# Patient Record
Sex: Female | Born: 1985 | Race: Black or African American | Hispanic: No | Marital: Single | Smoking: Current every day smoker
Health system: Southern US, Community
[De-identification: ages and names within clinical notes are randomized; demographics above are authoritative.]

## PROBLEM LIST (undated history)

## (undated) ENCOUNTER — Inpatient Hospital Stay (HOSPITAL_COMMUNITY): Payer: Self-pay

## (undated) DIAGNOSIS — D649 Anemia, unspecified: Secondary | ICD-10-CM

## (undated) DIAGNOSIS — F419 Anxiety disorder, unspecified: Secondary | ICD-10-CM

## (undated) DIAGNOSIS — Z3490 Encounter for supervision of normal pregnancy, unspecified, unspecified trimester: Secondary | ICD-10-CM

## (undated) HISTORY — PX: INDUCED ABORTION: SHX677

## (undated) HISTORY — PX: DILATION AND CURETTAGE OF UTERUS: SHX78

---

## 2012-01-28 ENCOUNTER — Encounter (HOSPITAL_COMMUNITY): Payer: Self-pay | Admitting: Family Medicine

## 2012-01-28 ENCOUNTER — Emergency Department (HOSPITAL_COMMUNITY): Payer: Self-pay

## 2012-01-28 ENCOUNTER — Emergency Department (HOSPITAL_COMMUNITY)
Admission: EM | Admit: 2012-01-28 | Discharge: 2012-01-28 | Disposition: A | Discharge status: Home / Self Care | Payer: Self-pay | Attending: Emergency Medicine | Admitting: Emergency Medicine

## 2012-01-28 DIAGNOSIS — S0003XA Contusion of scalp, initial encounter: Secondary | ICD-10-CM | POA: Insufficient documentation

## 2012-01-28 DIAGNOSIS — Z8659 Personal history of other mental and behavioral disorders: Secondary | ICD-10-CM | POA: Insufficient documentation

## 2012-01-28 DIAGNOSIS — S01501A Unspecified open wound of lip, initial encounter: Secondary | ICD-10-CM | POA: Insufficient documentation

## 2012-01-28 DIAGNOSIS — S0083XA Contusion of other part of head, initial encounter: Secondary | ICD-10-CM

## 2012-01-28 DIAGNOSIS — S46909A Unspecified injury of unspecified muscle, fascia and tendon at shoulder and upper arm level, unspecified arm, initial encounter: Secondary | ICD-10-CM | POA: Insufficient documentation

## 2012-01-28 DIAGNOSIS — Z862 Personal history of diseases of the blood and blood-forming organs and certain disorders involving the immune mechanism: Secondary | ICD-10-CM | POA: Insufficient documentation

## 2012-01-28 DIAGNOSIS — S4980XA Other specified injuries of shoulder and upper arm, unspecified arm, initial encounter: Secondary | ICD-10-CM | POA: Insufficient documentation

## 2012-01-28 DIAGNOSIS — S01511A Laceration without foreign body of lip, initial encounter: Secondary | ICD-10-CM

## 2012-01-28 DIAGNOSIS — F17200 Nicotine dependence, unspecified, uncomplicated: Secondary | ICD-10-CM | POA: Insufficient documentation

## 2012-01-28 DIAGNOSIS — S0990XA Unspecified injury of head, initial encounter: Secondary | ICD-10-CM | POA: Insufficient documentation

## 2012-01-28 HISTORY — DX: Anemia, unspecified: D64.9

## 2012-01-28 HISTORY — DX: Anxiety disorder, unspecified: F41.9

## 2012-01-28 MED ORDER — OXYCODONE-ACETAMINOPHEN 7.5-500 MG PO TABS: 1.0000 | ORAL_TABLET | ORAL | Status: DC | PRN
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
## 2012-01-28 NOTE — ED Notes (Signed)
Patient transported to CT 
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
## 2012-08-09 ENCOUNTER — Emergency Department (INDEPENDENT_AMBULATORY_CARE_PROVIDER_SITE_OTHER)
Admission: EM | Admit: 2012-08-09 | Discharge: 2012-08-09 | Disposition: A | Discharge status: Nursing Home (Includes ICF) | Payer: Medicaid Other | Source: Home / Self Care | Attending: Family Medicine | Admitting: Family Medicine

## 2012-08-09 ENCOUNTER — Encounter (HOSPITAL_COMMUNITY): Payer: Self-pay

## 2012-08-09 ENCOUNTER — Encounter (HOSPITAL_COMMUNITY): Payer: Self-pay | Admitting: *Deleted

## 2012-08-09 ENCOUNTER — Inpatient Hospital Stay (HOSPITAL_COMMUNITY): Payer: Medicaid Other

## 2012-08-09 ENCOUNTER — Inpatient Hospital Stay (HOSPITAL_COMMUNITY)
Admission: AD | Admit: 2012-08-09 | Discharge: 2012-08-09 | Disposition: A | Discharge status: Home / Self Care | Payer: Medicaid Other | Source: Ambulatory Visit | Attending: Obstetrics & Gynecology | Admitting: Obstetrics & Gynecology

## 2012-08-09 DIAGNOSIS — O98819 Other maternal infectious and parasitic diseases complicating pregnancy, unspecified trimester: Secondary | ICD-10-CM | POA: Insufficient documentation

## 2012-08-09 DIAGNOSIS — R109 Unspecified abdominal pain: Secondary | ICD-10-CM | POA: Insufficient documentation

## 2012-08-09 DIAGNOSIS — O4690 Antepartum hemorrhage, unspecified, unspecified trimester: Secondary | ICD-10-CM

## 2012-08-09 DIAGNOSIS — A5901 Trichomonal vulvovaginitis: Secondary | ICD-10-CM | POA: Insufficient documentation

## 2012-08-09 DIAGNOSIS — O4692 Antepartum hemorrhage, unspecified, second trimester: Secondary | ICD-10-CM

## 2012-08-09 DIAGNOSIS — N760 Acute vaginitis: Secondary | ICD-10-CM | POA: Insufficient documentation

## 2012-08-09 DIAGNOSIS — O2390 Unspecified genitourinary tract infection in pregnancy, unspecified trimester: Secondary | ICD-10-CM

## 2012-08-09 DIAGNOSIS — B9689 Other specified bacterial agents as the cause of diseases classified elsewhere: Secondary | ICD-10-CM | POA: Insufficient documentation

## 2012-08-09 DIAGNOSIS — O0930 Supervision of pregnancy with insufficient antenatal care, unspecified trimester: Secondary | ICD-10-CM | POA: Insufficient documentation

## 2012-08-09 DIAGNOSIS — O0932 Supervision of pregnancy with insufficient antenatal care, second trimester: Secondary | ICD-10-CM

## 2012-08-09 HISTORY — DX: Anemia, unspecified: D64.9

## 2012-08-09 HISTORY — DX: Encounter for supervision of normal pregnancy, unspecified, unspecified trimester: Z34.90

## 2012-08-09 LAB — WET PREP, GENITAL: Yeast Wet Prep HPF POC: NONE SEEN

## 2012-08-09 LAB — POCT PREGNANCY, URINE: Preg Test, Ur: POSITIVE — AB

## 2012-08-09 MED ORDER — ONDANSETRON HCL 4 MG PO TABS: 4.0000 mg | ORAL_TABLET | Freq: Once | ORAL | Status: DC

## 2012-08-09 MED ORDER — ONDANSETRON HCL 4 MG PO TABS
4.0000 mg | ORAL_TABLET | Freq: Once | ORAL | Status: AC
  Administered 2012-08-09: 4 mg via ORAL
  Filled 2012-08-09: qty 1

## 2012-08-09 MED ORDER — SODIUM CHLORIDE 0.9 % IV SOLN: Freq: Once | INTRAVENOUS | Status: AC

## 2012-08-09 MED ORDER — METRONIDAZOLE 500 MG PO TABS
2000.0000 mg | ORAL_TABLET | Freq: Once | ORAL | Status: AC
  Administered 2012-08-09: 2000 mg via ORAL
  Filled 2012-08-09: qty 4

## 2012-08-09 MED ORDER — AZITHROMYCIN 250 MG PO TABS
1000.0000 mg | ORAL_TABLET | Freq: Once | ORAL | Status: AC
  Administered 2012-08-09: 1000 mg via ORAL
  Filled 2012-08-09: qty 4

## 2012-08-09 NOTE — ED Provider Notes (Signed)
   History    CSN: [id number] Arrival date & time 08/09/12  1814  First MD Initiated Contact with Patient 08/09/12 1845     Chief Complaint  Patient presents with  . Vaginal Bleeding  . Abdominal Pain   (Consider location/radiation/quality/duration/timing/severity/associated sxs/prior Treatment) Patient is a 27 y.o. female presenting with vaginal bleeding and abdominal pain. The history is provided by the patient.  Vaginal Bleeding Quality:  Bright red Severity:  Moderate Duration:  3 days Timing:  Intermittent Progression:  Partially resolved (lmp 01/2012,no prenatal care, has had fetal movements today, G2P0.ab1.) Chronicity:  New Number of pads used:  2-3 daily. Possible pregnancy: yes   Associated symptoms: abdominal pain   Associated symptoms: no nausea   Abdominal Pain Associated symptoms include abdominal pain.   Past Medical History  Diagnosis Date  . Anemia   . Pregnancy    Past Surgical History  Procedure Laterality Date  . Induced abortion      Oct 2013   No family history on file. History  Substance Use Topics  . Smoking status: Never Smoker   . Smokeless tobacco: Not on file  . Alcohol Use: No   OB History   Grav Para Term Preterm Abortions TAB SAB Ect Mult Living                 Review of Systems  Constitutional: Negative.   Gastrointestinal: Positive for abdominal pain. Negative for nausea and vomiting.  Genitourinary: Positive for vaginal bleeding and pelvic pain. Negative for flank pain.    Allergies  Review of patient's allergies indicates no known allergies.  Home Medications  No current outpatient prescriptions on file. BP 113/74  Pulse 79  Temp(Src) 98.6 F (37 C) (Oral)  Resp 18  SpO2 100%  LMP 02/05/2012 Physical Exam  Nursing note and vitals reviewed. Constitutional: She is oriented to person, place, and time. She appears well-developed and well-nourished.  Abdominal: Soft. Bowel sounds are normal. She exhibits distension.  She exhibits no mass. There is no tenderness. There is no rebound and no guarding.  Genitourinary: Uterus is enlarged. Uterus is not tender.  fht 138 uterus at umbilicus, nontender.  Neurological: She is alert and oriented to person, place, and time.  Skin: Skin is warm and dry.    ED Course  Procedures (including critical care time) Labs Reviewed  POCT PREGNANCY, URINE - Abnormal; Notable for the following:    Preg Test, Ur POSITIVE (*)    All other components within normal limits   No results found. 1. Vaginal bleeding in pregnancy, second trimester     MDM  Sent for eval of 2nd trimester bleeding, no prenatal care.  Linna Hoff, MD 08/09/12 Mikle Bosworth

## 2012-08-09 NOTE — Discharge Instructions (Signed)
Trichomoniasis Trichomoniasis is an infection, caused by the Trichomonas organism, that affects both women and men. In women, the outer female genitalia and the vagina are affected. In men, the penis is mainly affected, but the prostate and other reproductive organs can also be involved. Trichomoniasis is a sexually transmitted disease (STD) and is most often passed to another person through sexual contact. The majority of people who get trichomoniasis do so from a sexual encounter and are also at risk for other STDs. CAUSES   Sexual intercourse with an infected partner.  It can be present in swimming pools or hot tubs. SYMPTOMS   Abnormal gray-green frothy vaginal discharge in women.  Vaginal itching and irritation in women.  Itching and irritation of the area outside the vagina in women.  Penile discharge with or without pain in males.  Inflammation of the urethra (urethritis), causing painful urination.  Bleeding after sexual intercourse. RELATED COMPLICATIONS  Pelvic inflammatory disease.  Infection of the uterus (endometritis).  Infertility.  Tubal (ectopic) pregnancy.  It can be associated with other STDs, including gonorrhea and chlamydia, hepatitis B, and HIV. COMPLICATIONS DURING PREGNANCY  Early (premature) delivery.  Premature rupture of the membranes (PROM).  Low birth weight. DIAGNOSIS   Visualization of Trichomonas under the microscope from the vagina discharge.  Ph of the vagina greater than 4.5, tested with a test tape.  Trich Rapid Test.  Culture of the organism, but this is not usually needed.  It may be found on a Pap test.  Having a "strawberry cervix,"which means the cervix looks very red like a strawberry. TREATMENT   You may be given medication to fight the infection. Inform your caregiver if you could be or are pregnant. Some medications used to treat the infection should not be taken during pregnancy.  Over-the-counter medications or  creams to decrease itching or irritation may be recommended.  Your sexual partner will need to be treated if infected. HOME CARE INSTRUCTIONS   Take all medication prescribed by your caregiver.  Take over-the-counter medication for itching or irritation as directed by your caregiver.  Do not have sexual intercourse while you have the infection.  Do not douche or wear tampons.  Discuss your infection with your partner, as your partner may have acquired the infection from you. Or, your partner may have been the person who transmitted the infection to you.  Have your sex partner examined and treated if necessary.  Practice safe, informed, and protected sex.  See your caregiver for other STD testing. SEEK MEDICAL CARE IF:   You still have symptoms after you finish the medication.  You have an oral temperature above 102 F (38.9 C).  You develop belly (abdominal) pain.  You have pain when you urinate.  You have bleeding after sexual intercourse.  You develop a rash.  The medication makes you sick or makes you throw up (vomit). Document Released: 07/09/2000 Document Revised: 04/07/2011 Document Reviewed: 08/04/2008 [facility] Patient Information 2014 [place], [place].  Sexually Transmitted Disease Sexually transmitted disease (STD) refers to any infection that is passed from person to person during sexual activity. This may happen by way of saliva, semen, blood, vaginal mucus, or urine. Common STDs include:  Gonorrhea.  Chlamydia.  Syphilis.  HIV/AIDS.  Genital herpes.  Hepatitis B and C.  Trichomonas.  Human papillomavirus (HPV).  Pubic lice. CAUSES  An STD may be spread by bacteria, virus, or parasite. A person can get an STD by:  Sexual intercourse with an infected person.  Sharing sex toys with an infected person.  Sharing needles with an infected person.  Having intimate contact with the genitals, mouth, or rectal areas of an infected person. SYMPTOMS   Some people may not have any symptoms, but they can still pass the infection to others. Different STDs have different symptoms. Symptoms include:  Painful or bloody urination.  Pain in the pelvis, abdomen, vagina, anus, throat, or eyes.  Skin rash, itching, irritation, growths, or sores (lesions). These usually occur in the genital or anal area.  Abnormal vaginal discharge.  Penile discharge in men.  Soft, flesh-colored skin growths in the genital or anal area.  Fever.  Pain or bleeding during sexual intercourse.  Swollen glands in the groin area.  Yellow skin and eyes (jaundice). This is seen with hepatitis. DIAGNOSIS  To make a diagnosis, your caregiver may:  Take a medical history.  Perform a physical exam.  Take a specimen (culture) to be examined.  Examine a sample of discharge under a microscope.  Perform blood tests.  Perform a Pap test, if this applies.  Perform a colposcopy.  Perform a laparoscopy. TREATMENT   Chlamydia, gonorrhea, trichomonas, and syphilis can be cured with antibiotic medicine.  Genital herpes, hepatitis, and HIV can be treated, but not cured, with prescribed medicines. The medicines will lessen the symptoms.  Genital warts from HPV can be treated with medicine or by freezing, burning (electrocautery), or surgery. Warts may come back.  HPV is a virus and cannot be cured with medicine or surgery.However, abnormal areas may be followed very closely by your caregiver and may be removed from the cervix, vagina, or vulva through office procedures or surgery. If your diagnosis is confirmed, your recent sexual partners need treatment. This is true even if they are symptom-free or have a negative culture or evaluation. They should not have sex until their caregiver says it is okay. HOME CARE INSTRUCTIONS  All sexual partners should be informed, tested, and treated for all STDs.  Take your antibiotics as directed. Finish them even if you start  to feel better.  Only take over-the-counter or prescription medicines for pain, discomfort, or fever as directed by your caregiver.  Rest.  Eat a balanced diet and drink enough fluids to keep your urine clear or pale yellow.  Do not have sex until treatment is completed and you have followed up with your caregiver. STDs should be checked after treatment.  Keep all follow-up appointments, Pap tests, and blood tests as directed by your caregiver.  Only use latex condoms and water-soluble lubricants during sexual activity. Do not use petroleum jelly or oils.  Avoid alcohol and illegal drugs.  Get vaccinated for HPV and hepatitis. If you have not received these vaccines in the past, talk to your caregiver about whether one or both might be right for you.  Avoid risky sex practices that can break the skin. The only way to avoid getting an STD is to avoid all sexual activity.Latex condoms and dental dams (for oral sex) will help lessen the risk of getting an STD, but will not completely eliminate the risk. SEEK MEDICAL CARE IF:   You have a fever.  You have any new or worsening symptoms. Document Released: 04/05/2002 Document Revised: 04/07/2011 Document Reviewed: 04/12/2010 [facility] Patient Information 2014 [place], [place].  Pregnancy - Second Trimester The second trimester of pregnancy (3 to 6 months) is a period of rapid growth for you and your baby. At the end of the sixth month, your baby is  about 9 inches long and weighs 1 1/2 pounds. You will begin to feel the baby move between 18 and 20 weeks of the pregnancy. This is called quickening. Weight gain is faster. A clear fluid (colostrum) may leak out of your breasts. You may feel small contractions of the womb (uterus). This is known as false labor or Braxton-Hicks contractions. This is like a practice for labor when the baby is ready to be born. Usually, the problems with morning sickness have usually passed by the end of your first  trimester. Some women develop small dark blotches (called cholasma, mask of pregnancy) on their face that usually goes away after the baby is born. Exposure to the sun makes the blotches worse. Acne may also develop in some pregnant women and pregnant women who have acne, may find that it goes away. PRENATAL EXAMS  Blood work may continue to be done during prenatal exams. These tests are done to check on your health and the probable health of your baby. Blood work is used to follow your blood levels (hemoglobin). Anemia (low hemoglobin) is common during pregnancy. Iron and vitamins are given to help prevent this. You will also be checked for diabetes between 24 and 28 weeks of the pregnancy. Some of the previous blood tests may be repeated.  The size of the uterus is measured during each visit. This is to make sure that the baby is continuing to grow properly according to the dates of the pregnancy.  Your blood pressure is checked every prenatal visit. This is to make sure you are not getting toxemia.  Your urine is checked to make sure you do not have an infection, diabetes or protein in the urine.  Your weight is checked often to make sure gains are happening at the suggested rate. This is to ensure that both you and your baby are growing normally.  Sometimes, an ultrasound is performed to confirm the proper growth and development of the baby. This is a test which bounces harmless sound waves off the baby so your caregiver can more accurately determine due dates. Sometimes, a test is done on the amniotic fluid surrounding the baby. This test is called an amniocentesis. The amniotic fluid is obtained by sticking a needle into the belly (abdomen). This is done to check the chromosomes in instances where there is a concern about possible genetic problems with the baby. It is also sometimes done near the end of pregnancy if an early delivery is required. In this case, it is done to help make sure the  baby's lungs are mature enough for the baby to live outside of the womb. CHANGES OCCURING IN THE SECOND TRIMESTER OF PREGNANCY Your body goes through many changes during pregnancy. They vary from person to person. Talk to your caregiver about changes you notice that you are concerned about.  During the second trimester, you will likely have an increase in your appetite. It is normal to have cravings for certain foods. This varies from person to person and pregnancy to pregnancy.  Your lower abdomen will begin to bulge.  You may have to urinate more often because the uterus and baby are pressing on your bladder. It is also common to get more bladder infections during pregnancy. You can help this by drinking lots of fluids and emptying your bladder before and after intercourse.  You may begin to get stretch marks on your hips, abdomen, and breasts. These are normal changes in the body during pregnancy. There are  no exercises or medicines to take that prevent this change.  You may begin to develop swollen and bulging veins (varicose veins) in your legs. Wearing support hose, elevating your feet for 15 minutes, 3 to 4 times a day and limiting salt in your diet helps lessen the problem.  Heartburn may develop as the uterus grows and pushes up against the stomach. Antacids recommended by your caregiver helps with this problem. Also, eating smaller meals 4 to 5 times a day helps.  Constipation can be treated with a stool softener or adding bulk to your diet. Drinking lots of fluids, and eating vegetables, fruits, and whole grains are helpful.  Exercising is also helpful. If you have been very active up until your pregnancy, most of these activities can be continued during your pregnancy. If you have been less active, it is helpful to start an exercise program such as walking.  Hemorrhoids may develop at the end of the second trimester. Warm sitz baths and hemorrhoid cream recommended by your caregiver  helps hemorrhoid problems.  Backaches may develop during this time of your pregnancy. Avoid heavy lifting, wear low heal shoes, and practice good posture to help with backache problems.  Some pregnant women develop tingling and numbness of their hand and fingers because of swelling and tightening of ligaments in the wrist (carpel tunnel syndrome). This goes away after the baby is born.  As your breasts enlarge, you may have to get a bigger bra. Get a comfortable, cotton, support bra. Do not get a nursing bra until the last month of the pregnancy if you will be nursing the baby.  You may get a dark line from your belly button to the pubic area called the linea nigra.  You may develop rosy cheeks because of increase blood flow to the face.  You may develop spider looking lines of the face, neck, arms, and chest. These go away after the baby is born. HOME CARE INSTRUCTIONS   It is extremely important to avoid all smoking, herbs, alcohol, and unprescribed drugs during your pregnancy. These chemicals affect the formation and growth of the baby. Avoid these chemicals throughout the pregnancy to ensure the delivery of a healthy infant.  Most of your home care instructions are the same as suggested for the first trimester of your pregnancy. Keep your caregiver's appointments. Follow your caregiver's instructions regarding medicine use, exercise, and diet.  During pregnancy, you are providing food for you and your baby. Continue to eat regular, well-balanced meals. Choose foods such as meat, fish, milk and other low fat dairy products, vegetables, fruits, and whole-grain breads and cereals. Your caregiver will tell you of the ideal weight gain.  A physical sexual relationship may be continued up until near the end of pregnancy if there are no other problems. Problems could include early (premature) leaking of amniotic fluid from the membranes, vaginal bleeding, abdominal pain, or other medical or  pregnancy problems.  Exercise regularly if there are no restrictions. Check with your caregiver if you are unsure of the safety of some of your exercises. The greatest weight gain will occur in the last 2 trimesters of pregnancy. Exercise will help you:  Control your weight.  Get you in shape for labor and delivery.  Lose weight after you have the baby.  Wear a good support or jogging bra for breast tenderness during pregnancy. This may help if worn during sleep. Pads or tissues may be used in the bra if you are leaking colostrum.  Do not use hot tubs, steam rooms or saunas throughout the pregnancy.  Wear your seat belt at all times when driving. This protects you and your baby if you are in an accident.  Avoid raw meat, uncooked cheese, cat litter boxes, and soil used by cats. These carry germs that can cause birth defects in the baby.  The second trimester is also a good time to visit your dentist for your dental health if this has not been done yet. Getting your teeth cleaned is okay. Use a soft toothbrush. Brush gently during pregnancy.  It is easier to leak urine during pregnancy. Tightening up and strengthening the pelvic muscles will help with this problem. Practice stopping your urination while you are going to the bathroom. These are the same muscles you need to strengthen. It is also the muscles you would use as if you were trying to stop from passing gas. You can practice tightening these muscles up 10 times a set and repeating this about 3 times per day. Once you know what muscles to tighten up, do not perform these exercises during urination. It is more likely to contribute to an infection by backing up the urine.  Ask for help if you have financial, counseling, or nutritional needs during pregnancy. Your caregiver will be able to offer counseling for these needs as well as refer you for other special needs.  Your skin may become oily. If so, wash your face with mild soap, use  non-greasy moisturizer and oil or cream based makeup. MEDICINES AND DRUG USE IN PREGNANCY  Take prenatal vitamins as directed. The vitamin should contain 1 milligram of folic acid. Keep all vitamins out of reach of children. Only a couple vitamins or tablets containing iron may be fatal to a baby or young child when ingested.  Avoid use of all medicines, including herbs, over-the-counter medicines, not prescribed or suggested by your caregiver. Only take over-the-counter or prescription medicines for pain, discomfort, or fever as directed by your caregiver. Do not use aspirin.  Let your caregiver also know about herbs you may be using.  Alcohol is related to a number of birth defects. This includes fetal alcohol syndrome. All alcohol, in any form, should be avoided completely. Smoking will cause low birth rate and premature babies.  Street or illegal drugs are very harmful to the baby. They are absolutely forbidden. A baby born to an addicted mother will be addicted at birth. The baby will go through the same withdrawal an adult does. SEEK MEDICAL CARE IF:  You have any concerns or worries during your pregnancy. It is better to call with your questions if you feel they cannot wait, rather than worry about them. SEEK IMMEDIATE MEDICAL CARE IF:   An unexplained oral temperature above 102 F (38.9 C) develops, or as your caregiver suggests.  You have leaking of fluid from the vagina (birth canal). If leaking membranes are suspected, take your temperature and tell your caregiver of this when you call.  There is vaginal spotting, bleeding, or passing clots. Tell your caregiver of the amount and how many pads are used. Light spotting in pregnancy is common, especially following intercourse.  You develop a bad smelling vaginal discharge with a change in the color from clear to white.  You continue to feel sick to your stomach (nauseated) and have no relief from remedies suggested. You vomit blood  or coffee ground-like materials.  You lose more than 2 pounds of weight or gain more than 2  pounds of weight over 1 week, or as suggested by your caregiver.  You notice swelling of your face, hands, feet, or legs.  You get exposed to [place] measles and have never had them.  You are exposed to fifth disease or chickenpox.  You develop belly (abdominal) pain. Round ligament discomfort is a common non-cancerous (benign) cause of abdominal pain in pregnancy. Your caregiver still must evaluate you.  You develop a bad headache that does not go away.  You develop fever, diarrhea, pain with urination, or shortness of breath.  You develop visual problems, blurry, or double vision.  You fall or are in a car accident or any kind of trauma.  There is mental or physical violence at home. Document Released: 01/07/2001 Document Revised: 10/08/2011 Document Reviewed: 07/12/2008 [facility] Patient Information 2014 [place], [place].

## 2012-08-10 LAB — GC/CHLAMYDIA PROBE AMP
CT Probe RNA: NEGATIVE
GC Probe RNA: NEGATIVE

## 2012-08-16 ENCOUNTER — Ambulatory Visit (HOSPITAL_COMMUNITY)
Admission: RE | Admit: 2012-08-16 | Discharge: 2012-08-16 | Disposition: A | Discharge status: Home / Self Care | Payer: Medicaid Other | Source: Ambulatory Visit | Attending: Family Medicine | Admitting: Family Medicine

## 2012-08-16 DIAGNOSIS — A5901 Trichomonal vulvovaginitis: Secondary | ICD-10-CM

## 2012-08-16 DIAGNOSIS — Z3689 Encounter for other specified antenatal screening: Secondary | ICD-10-CM | POA: Insufficient documentation

## 2012-08-16 DIAGNOSIS — O0930 Supervision of pregnancy with insufficient antenatal care, unspecified trimester: Secondary | ICD-10-CM | POA: Insufficient documentation

## 2012-09-01 ENCOUNTER — Encounter: Payer: Self-pay | Admitting: Family Medicine

## 2012-10-06 ENCOUNTER — Other Ambulatory Visit (HOSPITAL_COMMUNITY)
Admission: RE | Admit: 2012-10-06 | Discharge: 2012-10-06 | Disposition: A | Discharge status: Home / Self Care | Payer: Medicaid Other | Source: Ambulatory Visit | Attending: Obstetrics and Gynecology | Admitting: Obstetrics and Gynecology

## 2012-10-06 ENCOUNTER — Encounter: Payer: Self-pay | Admitting: Advanced Practice Midwife

## 2012-10-06 ENCOUNTER — Ambulatory Visit (INDEPENDENT_AMBULATORY_CARE_PROVIDER_SITE_OTHER): Payer: Medicaid Other | Admitting: Advanced Practice Midwife

## 2012-10-06 VITALS — BP 122/80 | Ht 65.0 in | Wt 169.5 lb

## 2012-10-06 DIAGNOSIS — Z3490 Encounter for supervision of normal pregnancy, unspecified, unspecified trimester: Secondary | ICD-10-CM

## 2012-10-06 DIAGNOSIS — Z01419 Encounter for gynecological examination (general) (routine) without abnormal findings: Secondary | ICD-10-CM | POA: Insufficient documentation

## 2012-10-06 DIAGNOSIS — O9989 Other specified diseases and conditions complicating pregnancy, childbirth and the puerperium: Secondary | ICD-10-CM

## 2012-10-06 DIAGNOSIS — Z113 Encounter for screening for infections with a predominantly sexual mode of transmission: Secondary | ICD-10-CM | POA: Insufficient documentation

## 2012-10-06 DIAGNOSIS — O0932 Supervision of pregnancy with insufficient antenatal care, second trimester: Secondary | ICD-10-CM

## 2012-10-06 DIAGNOSIS — O26899 Other specified pregnancy related conditions, unspecified trimester: Secondary | ICD-10-CM

## 2012-10-06 DIAGNOSIS — R12 Heartburn: Secondary | ICD-10-CM

## 2012-10-06 DIAGNOSIS — O0930 Supervision of pregnancy with insufficient antenatal care, unspecified trimester: Secondary | ICD-10-CM

## 2012-10-06 LAB — POCT URINALYSIS DIP (DEVICE)
Bilirubin Urine: NEGATIVE
Glucose, UA: NEGATIVE mg/dL
Hgb urine dipstick: NEGATIVE
Leukocytes, UA: NEGATIVE
Nitrite: NEGATIVE
Protein, ur: NEGATIVE mg/dL
Specific Gravity, Urine: 1.025 (ref 1.005–1.030)
Urobilinogen, UA: 4 mg/dL — ABNORMAL HIGH (ref 0.0–1.0)
pH: 6 (ref 5.0–8.0)

## 2012-10-06 MED ORDER — RANITIDINE HCL 150 MG PO TABS: 150.0000 mg | ORAL_TABLET | Freq: Two times a day (BID) | ORAL | Status: AC

## 2012-10-06 NOTE — Progress Notes (Addendum)
   Subjective:    Ashley Lee is a G3P0020 [redacted]w[redacted]d being seen today for her first obstetrical visit.  Her obstetrical history is significant for 1 TAB and 1 SAB and late to care this pregnancy. Patient does intend to breast feed. Pregnancy history fully reviewed.  Patient reports heartburn.  Filed Vitals:   10/06/12 0851 10/06/12 0854  BP: 122/80   Height:  5\' 5"  (1.651 m)  Weight: 169 lb 8 oz (76.885 kg)     HISTORY: OB History  Gravida Para Term Preterm AB SAB TAB Ectopic Multiple Living  3 0 0 0 2 1 1 0 0 0     # Outcome Date GA Lbr Len/2nd Weight Sex Delivery Anes PTL Lv  3 CUR           2 SAB           1 TAB              Past Medical History  Diagnosis Date  . Anemia   . Pregnancy    Past Surgical History  Procedure Laterality Date  . Induced abortion      Oct 2013  . Dilation and curettage of uterus     Family History  Problem Relation Age of Onset  . Cancer Maternal Grandmother   . Cancer Paternal Grandmother   . Diabetes Paternal Grandmother   . Hypertension Paternal Grandmother      Exam    Uterus:  Fundal Height: 32 cm  Pelvic Exam:    Perineum: No Hemorrhoids, Normal Perineum   Vulva: normal   Vagina:  normal mucosa, normal discharge   pH:    Cervix: no bleeding following Pap, no cervical motion tenderness, no lesions and nulliparous appearance   Adnexa: not evaluated   Bony Pelvis: average  System: Breast:  normal appearance, no masses or tenderness   Skin: normal coloration and turgor, no rashes    Neurologic: oriented, normal, normal mood   Extremities: normal strength, tone, and muscle mass   HEENT thyroid without masses   Mouth/Teeth mucous membranes moist, pharynx normal without lesions   Neck supple   Cardiovascular: regular rate and rhythm   Respiratory:  appears well, vitals normal, no respiratory distress, acyanotic, normal RR, ear and throat exam is normal, neck free of mass or lymphadenopathy, chest clear, no wheezing,  crepitations, rhonchi, normal symmetric air entry   Abdomen: soft, non-tender; bowel sounds normal; no masses,  no organomegaly   Urinary: urethral meatus normal      Assessment:    Pregnancy: G3P0020 There are no active problems to display for this patient.       Plan:     Initial labs drawn. Prenatal vitamins. Zantac 150 mg BID. Problem list reviewed and updated. Genetic Screening discussed: Late to care  Ultrasound discussed; fetal survey: results reviewed.  Follow up in 2 weeks. 50% of 30 min visit spent on counseling and coordination of care.     LEFTWICH-KIRBY, Faiza Bansal 10/06/2012

## 2012-10-07 LAB — CULTURE, OB URINE
Colony Count: NO GROWTH
Organism ID, Bacteria: NO GROWTH

## 2012-10-07 LAB — HIV ANTIBODY (ROUTINE TESTING W REFLEX): HIV: NONREACTIVE

## 2012-10-07 LAB — GLUCOSE TOLERANCE, 1 HOUR (50G) W/O FASTING: Glucose, 1 Hour GTT: 60 mg/dL — ABNORMAL LOW (ref 70–140)

## 2012-10-08 LAB — OBSTETRIC PANEL
Antibody Screen: POSITIVE — AB
Basophils Absolute: 0 10*3/uL (ref 0.0–0.1)
Basophils Relative: 0 % (ref 0–1)
Eosinophils Absolute: 0.3 10*3/uL (ref 0.0–0.7)
Eosinophils Relative: 4 % (ref 0–5)
HCT: 33.1 % — ABNORMAL LOW (ref 36.0–46.0)
Hemoglobin: 11.1 g/dL — ABNORMAL LOW (ref 12.0–15.0)
Hepatitis B Surface Ag: NEGATIVE
Lymphocytes Relative: 28 % (ref 12–46)
Lymphs Abs: 2.2 10*3/uL (ref 0.7–4.0)
MCH: 33.6 pg (ref 26.0–34.0)
MCHC: 33.5 g/dL (ref 30.0–36.0)
MCV: 100.3 fL — ABNORMAL HIGH (ref 78.0–100.0)
Monocytes Absolute: 0.7 10*3/uL (ref 0.1–1.0)
Monocytes Relative: 8 % (ref 3–12)
Neutro Abs: 4.6 10*3/uL (ref 1.7–7.7)
Neutrophils Relative %: 60 % (ref 43–77)
Platelets: 309 10*3/uL (ref 150–400)
RBC: 3.3 MIL/uL — ABNORMAL LOW (ref 3.87–5.11)
RDW: 13.5 % (ref 11.5–15.5)
Rh Type: NEGATIVE
Rubella: 6.31 Index — ABNORMAL HIGH (ref <0.90)
WBC: 7.8 10*3/uL (ref 4.0–10.5)

## 2012-10-08 LAB — PRENATAL ANTIBODY IDENTIFICATION

## 2012-10-08 LAB — HEMOGLOBINOPATHY EVALUATION
Hemoglobin Other: 0 %
Hgb A2 Quant: 2.6 % (ref 2.2–3.2)
Hgb A: 97.4 % (ref 96.8–97.8)
Hgb F Quant: 0 % (ref 0.0–2.0)
Hgb S Quant: 0 %

## 2012-10-08 LAB — ANTIBODY TITER (PRENATAL TITER): Ab Titer: 16

## 2012-10-13 ENCOUNTER — Encounter: Payer: Self-pay | Admitting: Advanced Practice Midwife

## 2012-10-20 ENCOUNTER — Encounter: Payer: Medicaid Other | Admitting: Advanced Practice Midwife

## 2013-06-14 ENCOUNTER — Encounter (HOSPITAL_COMMUNITY): Payer: Self-pay | Admitting: *Deleted

## 2013-11-28 ENCOUNTER — Encounter (HOSPITAL_COMMUNITY): Payer: Self-pay | Admitting: *Deleted
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
## 2022-06-09 ENCOUNTER — Other Ambulatory Visit (HOSPITAL_COMMUNITY)
Admission: EM | Admit: 2022-06-09 | Discharge: 2022-06-13 | Payer: Medicaid - Out of State | Attending: Psychiatry | Admitting: Psychiatry

## 2022-06-09 DIAGNOSIS — F3181 Bipolar II disorder: Secondary | ICD-10-CM | POA: Insufficient documentation

## 2022-06-09 DIAGNOSIS — F10229 Alcohol dependence with intoxication, unspecified: Secondary | ICD-10-CM | POA: Insufficient documentation

## 2022-06-09 DIAGNOSIS — R45851 Suicidal ideations: Secondary | ICD-10-CM | POA: Diagnosis not present

## 2022-06-09 DIAGNOSIS — F1090 Alcohol use, unspecified, uncomplicated: Secondary | ICD-10-CM | POA: Diagnosis present

## 2022-06-09 DIAGNOSIS — F4310 Post-traumatic stress disorder, unspecified: Secondary | ICD-10-CM | POA: Insufficient documentation

## 2022-06-09 DIAGNOSIS — D539 Nutritional anemia, unspecified: Secondary | ICD-10-CM | POA: Insufficient documentation

## 2022-06-09 DIAGNOSIS — E538 Deficiency of other specified B group vitamins: Secondary | ICD-10-CM | POA: Insufficient documentation

## 2022-06-09 LAB — POC URINE PREG, ED: Preg Test, Ur: NEGATIVE

## 2022-06-09 LAB — POCT URINE DRUG SCREEN - MANUAL ENTRY (I-SCREEN)
POC Amphetamine UR: NOT DETECTED
POC Buprenorphine (BUP): NOT DETECTED
POC Cocaine UR: NOT DETECTED
POC Marijuana UR: POSITIVE — AB
POC Methadone UR: NOT DETECTED
POC Methamphetamine UR: NOT DETECTED
POC Morphine: NOT DETECTED
POC Oxazepam (BZO): NOT DETECTED
POC Oxycodone UR: NOT DETECTED
POC Secobarbital (BAR): NOT DETECTED

## 2022-06-09 LAB — CBC WITH DIFFERENTIAL/PLATELET
Abs Immature Granulocytes: 0.01 10*3/uL (ref 0.00–0.07)
Basophils Absolute: 0 10*3/uL (ref 0.0–0.1)
Basophils Relative: 0 %
Eosinophils Absolute: 0.1 10*3/uL (ref 0.0–0.5)
Eosinophils Relative: 1 %
HCT: 36.8 % (ref 36.0–46.0)
Hemoglobin: 11.9 g/dL — ABNORMAL LOW (ref 12.0–15.0)
Immature Granulocytes: 0 %
Lymphocytes Relative: 44 %
Lymphs Abs: 2.3 10*3/uL (ref 0.7–4.0)
MCH: 33.6 pg (ref 26.0–34.0)
MCHC: 32.3 g/dL (ref 30.0–36.0)
MCV: 104 fL — ABNORMAL HIGH (ref 80.0–100.0)
Monocytes Absolute: 0.2 10*3/uL (ref 0.1–1.0)
Monocytes Relative: 5 %
Neutro Abs: 2.5 10*3/uL (ref 1.7–7.7)
Neutrophils Relative %: 50 %
Platelets: 439 10*3/uL — ABNORMAL HIGH (ref 150–400)
RBC: 3.54 MIL/uL — ABNORMAL LOW (ref 3.87–5.11)
RDW: 13.1 % (ref 11.5–15.5)
WBC: 5.1 10*3/uL (ref 4.0–10.5)
nRBC: 0 % (ref 0.0–0.2)

## 2022-06-09 LAB — COMPREHENSIVE METABOLIC PANEL
ALT: 12 U/L (ref 0–44)
AST: 20 U/L (ref 15–41)
Albumin: 4.3 g/dL (ref 3.5–5.0)
Alkaline Phosphatase: 56 U/L (ref 38–126)
Anion gap: 10 (ref 5–15)
BUN: 6 mg/dL (ref 6–20)
CO2: 26 mmol/L (ref 22–32)
Calcium: 9.6 mg/dL (ref 8.9–10.3)
Chloride: 103 mmol/L (ref 98–111)
Creatinine, Ser: 0.6 mg/dL (ref 0.44–1.00)
GFR, Estimated: >60 mL/min (ref >60)
Glucose, Bld: 91 mg/dL (ref 70–99)
Potassium: 3.2 mmol/L — ABNORMAL LOW (ref 3.5–5.1)
Sodium: 139 mmol/L (ref 135–145)
Total Bilirubin: 0.7 mg/dL (ref 0.3–1.2)
Total Protein: 7.3 g/dL (ref 6.5–8.1)

## 2022-06-09 LAB — ETHANOL: Alcohol, Ethyl (B): 66 mg/dL — ABNORMAL HIGH (ref <10)

## 2022-06-09 LAB — TSH: TSH: 1.289 u[IU]/mL (ref 0.350–4.500)

## 2022-06-09 LAB — MAGNESIUM: Magnesium: 2.1 mg/dL (ref 1.7–2.4)

## 2022-06-09 MED ORDER — ALUM & MAG HYDROXIDE-SIMETH 200-200-20 MG/5ML PO SUSP: 30.0000 mL | ORAL | Status: DC | PRN

## 2022-06-09 MED ORDER — LORAZEPAM 1 MG PO TABS
1.0000 mg | ORAL_TABLET | Freq: Four times a day (QID) | ORAL | Status: AC | PRN
  Administered 2022-06-10 – 2022-06-11 (×2): 1 mg via ORAL
  Filled 2022-06-09 (×2): qty 1

## 2022-06-09 MED ORDER — THIAMINE MONONITRATE 100 MG PO TABS
100.0000 mg | ORAL_TABLET | Freq: Every day | ORAL | Status: DC
  Administered 2022-06-10 – 2022-06-13 (×4): 100 mg via ORAL
  Filled 2022-06-09 (×4): qty 1

## 2022-06-09 MED ORDER — ONDANSETRON 4 MG PO TBDP: 4.0000 mg | ORAL_TABLET | Freq: Four times a day (QID) | ORAL | Status: DC | PRN

## 2022-06-09 MED ORDER — THIAMINE HCL 100 MG/ML IJ SOLN
100.0000 mg | Freq: Once | INTRAMUSCULAR | Status: AC
  Administered 2022-06-09: 100 mg via INTRAMUSCULAR
  Filled 2022-06-09: qty 2

## 2022-06-09 MED ORDER — MAGNESIUM HYDROXIDE 400 MG/5ML PO SUSP: 30.0000 mL | Freq: Every day | ORAL | Status: DC | PRN

## 2022-06-09 MED ORDER — NICOTINE POLACRILEX 2 MG MT GUM
4.0000 mg | CHEWING_GUM | OROMUCOSAL | Status: DC | PRN
  Administered 2022-06-09 – 2022-06-11 (×2): 4 mg via ORAL
  Administered 2022-06-12: 2 mg via ORAL
  Filled 2022-06-09 (×3): qty 2

## 2022-06-09 MED ORDER — LORAZEPAM 1 MG PO TABS: 1.0000 mg | ORAL_TABLET | Freq: Three times a day (TID) | ORAL | Status: DC

## 2022-06-09 MED ORDER — TRAZODONE HCL 50 MG PO TABS
50.0000 mg | ORAL_TABLET | Freq: Every evening | ORAL | Status: DC | PRN
  Administered 2022-06-09 – 2022-06-12 (×4): 50 mg via ORAL
  Filled 2022-06-09 (×4): qty 1

## 2022-06-09 MED ORDER — HYDROXYZINE HCL 25 MG PO TABS
25.0000 mg | ORAL_TABLET | Freq: Three times a day (TID) | ORAL | Status: DC | PRN
  Administered 2022-06-09 – 2022-06-10 (×2): 25 mg via ORAL
  Filled 2022-06-09: qty 1

## 2022-06-09 MED ORDER — LOPERAMIDE HCL 2 MG PO CAPS: 2.0000 mg | ORAL_CAPSULE | ORAL | Status: DC | PRN

## 2022-06-09 MED ORDER — NICOTINE 14 MG/24HR TD PT24
14.0000 mg | MEDICATED_PATCH | Freq: Every day | TRANSDERMAL | Status: DC
  Administered 2022-06-10 – 2022-06-13 (×4): 14 mg via TRANSDERMAL
  Filled 2022-06-09 (×4): qty 1

## 2022-06-09 MED ORDER — LORAZEPAM 1 MG PO TABS
1.0000 mg | ORAL_TABLET | Freq: Four times a day (QID) | ORAL | Status: DC
  Administered 2022-06-09 (×2): 1 mg via ORAL
  Filled 2022-06-09 (×2): qty 1

## 2022-06-09 MED ORDER — LORAZEPAM 1 MG PO TABS: 1.0000 mg | ORAL_TABLET | Freq: Two times a day (BID) | ORAL | Status: DC

## 2022-06-09 MED ORDER — LORAZEPAM 1 MG PO TABS: 1.0000 mg | ORAL_TABLET | Freq: Every day | ORAL | Status: DC

## 2022-06-09 MED ORDER — ACETAMINOPHEN 325 MG PO TABS: 650.0000 mg | ORAL_TABLET | Freq: Four times a day (QID) | ORAL | Status: DC | PRN

## 2022-06-09 MED ORDER — ADULT MULTIVITAMIN W/MINERALS CH
1.0000 | ORAL_TABLET | Freq: Every day | ORAL | Status: DC
  Administered 2022-06-09 – 2022-06-13 (×5): 1 via ORAL
  Filled 2022-06-09 (×5): qty 1

## 2022-06-09 NOTE — ED Provider Notes (Signed)
Facility Based Crisis Admission H&P  Date: 06/09/22 Patient Name: Ashley Lee MRN: [id number] Chief Complaint: Alcohol use disorder, passive suicidal ideations  Diagnoses:  Final diagnoses:  Alcohol use disorder  Passive suicidal ideations    HPI: Patient presents voluntarily to [facility] behavioral health for walk-in assessment.  She is assessed by this nurse practitioner face-to-face.  She is seated in assessment area, no apparent distress.  She is alert and oriented, pleasant and cooperative during assessment.  Patient presents with depressed mood, tearful affect.  Patient states "I had to sign over the rights to my son today, I just need a break for my mental health, some time to sit and let things sink in."  Patient endorses alcohol use disorder.  She would like to seek residential substance use treatment.  Patient states "I have a lot of good things to offer my son, I just need to get better, it is time for me to do that."  Patient endorses passive suicidal ideation x 2 days.  She denies plan or intent to harm self.  She states "I have thought that I wish that I was not here but I would never do that."  No history of suicide attempts, no history of nonsuicidal self-harm behavior.  She denies auditory visual hallucinations.  There is no evidence of delusional thought content no indication that patient is responding to internal stimuli.  She denies symptoms of paranoia.  Ashley Lee reports aside from signing over the rights to her 22-month-old son to her mother today additional stressors include the death of her sister 2 years ago.  Patient reports she argued with her sister on the day her sister died.  She states "my family has not let me live that down." Sister passed away after heroin/fentanyl ingestion.   She also endorses a strained relationship with her mother.  She is currently homeless because she is unable to return to the home of her mother, reports constant arguing with  her mother.  Patient's brother told her today that she should "kill herself."  Patient reports history of bipolar disorder and a personality disorder.  She has not been linked with outpatient psychiatry for approximately 2 years.  Unable to recall medications, no medications for 2 years.  No history of inpatient psychiatric hospitalization.  Family mental health and addiction history includes patient's father who was diagnosed with an alcohol use disorder.  Ashley Lee endorses daily alcohol use for approximately 20 years.  She used 1 glass of wine prior to arrival today.  She uses varying amounts of alcohol, has used up to 2 L of vodka in 1 day.  No history of delirium tremens, no history of alcohol-related seizure.  2 years ago she was admitted to a residential program in [place] for alcohol detox,  [facility].  She remained this program 6 months prior to relapse on alcohol.    Patient is currently homeless in [place].  Relocated to [place] from [place] 2 weeks ago.  She is not employed.  She denies substance use aside from alcohol.  UDS positive for marijuana, 06/09/2022. She is nicotine daily.  She endorses decreased sleep and average appetite.  Patient offered support and encouragement.  She agrees with plan for admission to facility based crisis unit.  Reviewed medications including hydroxyzine, trazodone and lorazepam, discussed potential side effects and offered patient opportunity to ask questions.  She confirms desire to remain full CODE STATUS.  PHQ 2-9:   Flowsheet Row ED from 06/09/2022 in [facility]  Center  C-SSRS RISK CATEGORY Low Risk         Total Time spent with patient: 45 minutes  Musculoskeletal  Strength & Muscle Tone: within normal limits Gait & Station: normal Patient leans: N/A  Psychiatric Specialty Exam  Presentation General Appearance:  Appropriate for Environment; Casual  Eye Contact: Good  Speech: Clear and Coherent; Normal  Rate  Speech Volume: Normal  Handedness: Right   Mood and Affect  Mood: Depressed  Affect: Depressed; Tearful   Thought Process  Thought Processes: Coherent; Goal Directed; Linear  Descriptions of Associations:Intact  Orientation:Full (Time, Place and Person)  Thought Content:Logical; WDL    Hallucinations:Hallucinations: None  Ideas of Reference:None  Suicidal Thoughts:Suicidal Thoughts: Yes, Passive SI Passive Intent and/or Plan: Without Intent; Without Plan  Homicidal Thoughts:Homicidal Thoughts: No   Sensorium  Memory: Immediate Good; Recent Fair  Judgment: Fair  Insight: Fair   Executive Functions  Concentration: Good  Attention Span: Good  Recall: Good  Fund of Knowledge: Good  Language: Good   Psychomotor Activity  Psychomotor Activity: Psychomotor Activity: Normal   Assets  Assets: Communication Skills; Desire for Improvement; Physical Health; Resilience; Social Support   Sleep  Sleep: Sleep: Poor   Nutritional Assessment (For OBS and FBC admissions only) Has the patient had a weight loss or gain of 10 pounds or more in the last 3 months?: No Has the patient had a decrease in food intake/or appetite?: No Does the patient have dental problems?: No Does the patient have eating habits or behaviors that may be indicators of an eating disorder including binging or inducing vomiting?: No Has the patient recently lost weight without trying?: 0 Has the patient been eating poorly because of a decreased appetite?: 0 Malnutrition Screening Tool Score: 0    Physical Exam Vitals and nursing note reviewed.  Constitutional:      Appearance: Normal appearance. She is well-developed and normal weight.  HENT:     Head: Normocephalic and atraumatic.     Nose: Nose normal.  Cardiovascular:     Rate and Rhythm: Normal rate.  Pulmonary:     Effort: Pulmonary effort is normal.  Musculoskeletal:        General: Normal range of  motion.     Cervical back: Normal range of motion.  Skin:    General: Skin is warm and dry.  Neurological:     Mental Status: She is alert and oriented to person, place, and time.  Psychiatric:        Attention and Perception: Attention and perception normal.        Mood and Affect: Mood is depressed. Affect is tearful.        Speech: Speech normal.        Behavior: Behavior normal. Behavior is cooperative.        Thought Content: Thought content includes suicidal ideation.        Cognition and Memory: Cognition and memory normal.   Review of Systems  Constitutional: Negative.   HENT: Negative.    Eyes: Negative.   Respiratory: Negative.    Cardiovascular: Negative.   Gastrointestinal: Negative.   Genitourinary: Negative.   Musculoskeletal: Negative.   Skin: Negative.   Neurological: Negative.   Psychiatric/Behavioral:  Positive for depression, substance abuse and suicidal ideas.     Blood pressure (!) 128/95, pulse 86, temperature 98.4 F (36.9 C), temperature source Oral, resp. rate 18, SpO2 100 %. There is no height or weight on file to calculate BMI.  Past Psychiatric  History: Per patient report, bipolar disorder and "personality disorder"  Is the patient at risk to self? No  Has the patient been a risk to self in the past 6 months? No .    Has the patient been a risk to self within the distant past? No   Is the patient a risk to others? No   Has the patient been a risk to others in the past 6 months? No   Has the patient been a risk to others within the distant past? No   Past Medical History: Anemia, sickle cell trait Family History: Father alcohol use disorder Social History: Currently homeless, unemployed, conflict with family  Last Labs:  No visits with results within 6 Month(s) from this visit.  Latest known visit with results is:  No results found for any previous visit.    Allergies: Patient has no known allergies.  Medications:  Facility Ordered  Medications  Medication   acetaminophen (TYLENOL) tablet 650 mg   alum & mag hydroxide-simeth (MAALOX/MYLANTA) 200-200-20 MG/5ML suspension 30 mL   magnesium hydroxide (MILK OF MAGNESIA) suspension 30 mL   thiamine (VITAMIN B1) injection 100 mg   [START ON 06/10/2022] thiamine (VITAMIN B1) tablet 100 mg   multivitamin with minerals tablet 1 tablet   LORazepam (ATIVAN) tablet 1 mg   loperamide (IMODIUM) capsule 2-4 mg   ondansetron (ZOFRAN-ODT) disintegrating tablet 4 mg   hydrOXYzine (ATARAX) tablet 25 mg   traZODone (DESYREL) tablet 50 mg   LORazepam (ATIVAN) tablet 1 mg   Followed by   Melene Muller ON 06/11/2022] LORazepam (ATIVAN) tablet 1 mg   Followed by   Melene Muller ON 06/12/2022] LORazepam (ATIVAN) tablet 1 mg   Followed by   Melene Muller ON 06/13/2022] LORazepam (ATIVAN) tablet 1 mg   nicotine (NICODERM CQ - dosed in mg/24 hours) patch 14 mg   PTA Medications  Medication Sig   oxyCODONE-acetaminophen (PERCOCET) 7.5-500 MG per tablet Take 1 tablet by mouth every 4 (four) hours as needed for pain.    Long Term Goals: Improvement in symptoms so as ready for discharge  Short Term Goals: Patient will verbalize feelings in meetings with treatment team members., Patient will attend at least of 50% of the groups daily., Pt will complete the PHQ9 on admission, day 3 and discharge., Patient will participate in completing the [place] Suicide Severity Rating Scale, Patient will score a low risk of violence for 24 hours prior to discharge, and Patient will take medications as prescribed daily.  Medical Decision Making  Patient remains voluntary.  She will be admitted to facility based crisis unit at [facility] health.  She would like to seek residential substance use treatment moving forward.  Laboratory studies ordered including CBC, CMP, ethanol, magnesium and TSH.  Urine drug screen order initiated.  EKG ordered.  Current medications: -Acetaminophen 650 mg every 6 as needed/mild  pain -Maalox 30 mL oral every 4 as needed/digestion -Hydroxyzine 25 mg 3 times daily as needed/anxiety -Magnesium hydroxide 30 mL daily as needed/mild constipation -Trazodone 50 mg nightly as needed/sleep -NicoDerm 14 mg transdermal patch daily/nicotine withdrawal  CIWA Ativan protocol initiated: -Loperamide 2 to 4 mg oral as needed/diarrhea or loose stools -Lorazepam 1 mg 4 times daily x4 doses, 1 mg 3 times daily x3 doses, 1 mg 2 times daily x2 doses, 1 mg daily x1 dose -Lorazepam 1 mg every 6 hours as needed CIWA greater than 10 -Multivitamin with minerals 1 tablet daily -Ondansetron disintegrating tablet 4 mg every 6 as needed/nausea  or vomiting -Thiamine injection 100 mg IM once -Thiamine tablet 100 mg daily     Recommendations  Based on my evaluation the patient does not appear to have an emergency medical condition.  Lenard Lance, FNP 06/09/22  6:00 PM

## 2022-06-09 NOTE — ED Notes (Signed)
Patient observed/assessed in room in bed, sitting on edge of bed working on a paper appearing in no immediate distress resting peacefully. Q15 minute checks continued by MHT and nursing staff. Will continue to monitor and support.

## 2022-06-09 NOTE — ED Notes (Signed)
Snacks given 

## 2022-06-09 NOTE — Progress Notes (Addendum)
   06/09/22 1656  [facility] Triage Screening (Walk-ins at [facility] only)  How Did You Hear About [place]? Self  What Is the Reason for Your Visit/Call Today? Pt presents to [facility] voluntarily seeking a mental health evaluation due to worsening depression symptoms. Pt came into the building and decided to walk outside stating that she needed a minute prior to coming into the facility to speak with the provider. Pt reports she had to sign over custody for her son today. Pt reports passive SI, no plan or intent to harm herself at this time.Pt reports insomnia, unstable living conditions, Personality disorder and Bipolar disorder diagnosis. Pt states she has not been on medication for about 1 year.Pt reports alcohol use daily about 4 liters of liquor. Last use was today about a few glasses of wine.  How Long Has This Been Causing You Problems? <Week  Have You Recently Had Any Thoughts About Hurting Yourself? Yes  How long ago did you have thoughts about hurting yourself? today  Are You Planning to Commit Suicide/Harm Yourself At This time? No  Have you Recently Had Thoughts About Hurting Someone Karolee Ohs? No  Are You Planning To Harm Someone At This Time? No  Are you currently experiencing any auditory, visual or other hallucinations? No  Have You Used Any Alcohol or Drugs in the Past 24 Hours? Yes  How long ago did you use Drugs or Alcohol? today  What Did You Use and How Much? few glasses of wine  Do you have any current medical co-morbidities that require immediate attention? No  Clinician description of patient physical appearance/behavior: anxious, tearful  What Do You Feel Would Help You the Most Today? Treatment for Depression or other mood problem  If access to [facility] was not available, would you have sought care in the Emergency Department? No  Determination of Need Urgent (48 hours)  Options For Referral Facility-Based Crisis

## 2022-06-09 NOTE — Group Note (Signed)
Group Topic: Relaxation  Group Date: 06/09/2022 Start Time: 2020 End Time: 2030 Facilitators: Levander Campion  Department: [facility]  Number of Participants: 6  Group Focus: anxiety and coping skills Treatment Modality:  Individual Therapy Interventions utilized were group exercise, patient education, and problem solving Purpose: enhance coping skills, express feelings, express irrational fears, and relapse prevention strategies  Name: Ashley Lee Date of Birth: 1985/09/06  MR: [id number]    Level of Participation: moderate Quality of Participation: attentive Interactions with others: gave feedback Mood/Affect: appropriate Triggers (if applicable): n/a Cognition: coherent/clear Progress: Moderate Response: n/a Plan: follow-up needed  Patients Problems:  Patient Active Problem List   Diagnosis Date Noted   Alcohol use disorder 06/09/2022

## 2022-06-09 NOTE — Group Note (Unsigned)
Group Topic: Relaxation  Group Date: 06/09/2022 Start Time: 2030 End Time: 2050 Facilitators: Levander Campion  Department: [facility]  Number of Participants: 7  Group Focus: anxiety and coping skills Treatment Modality:  Individual Therapy Interventions utilized were assignment, patient education, and problem solving Purpose: enhance coping skills   Name: Ireri Files Date of Birth: 1985/02/02  MR: [id number]    Level of Participation: {THERAPIES; PSYCH GROUP PARTICIPATION WUJWJ:19147} Quality of Participation: {THERAPIES; PSYCH QUALITY OF PARTICIPATION:23992} Interactions with others: {THERAPIES; PSYCH INTERACTIONS:23993} Mood/Affect: {THERAPIES; PSYCH MOOD/AFFECT:23994} Triggers (if applicable): *** Cognition: {THERAPIES; PSYCH COGNITION:23995} Progress: {THERAPIES; PSYCH PROGRESS:23997} Response: *** Plan: {THERAPIES; PSYCH WGNF:62130}  Patients Problems:  Patient Active Problem List   Diagnosis Date Noted   Alcohol use disorder 06/09/2022

## 2022-06-10 DIAGNOSIS — F4310 Post-traumatic stress disorder, unspecified: Secondary | ICD-10-CM | POA: Diagnosis not present

## 2022-06-10 DIAGNOSIS — R45851 Suicidal ideations: Secondary | ICD-10-CM | POA: Diagnosis not present

## 2022-06-10 DIAGNOSIS — F10229 Alcohol dependence with intoxication, unspecified: Secondary | ICD-10-CM | POA: Diagnosis not present

## 2022-06-10 DIAGNOSIS — F1090 Alcohol use, unspecified, uncomplicated: Secondary | ICD-10-CM

## 2022-06-10 DIAGNOSIS — F3181 Bipolar II disorder: Secondary | ICD-10-CM | POA: Diagnosis not present

## 2022-06-10 LAB — VITAMIN B12: Vitamin B-12: 169 pg/mL — ABNORMAL LOW (ref 180–914)

## 2022-06-10 LAB — GLUCOSE, CAPILLARY: Glucose-Capillary: 124 mg/dL — ABNORMAL HIGH (ref 70–99)

## 2022-06-10 LAB — BASIC METABOLIC PANEL
Anion gap: 10 (ref 5–15)
BUN: 8 mg/dL (ref 6–20)
CO2: 27 mmol/L (ref 22–32)
Calcium: 9.5 mg/dL (ref 8.9–10.3)
Chloride: 101 mmol/L (ref 98–111)
Creatinine, Ser: 0.63 mg/dL (ref 0.44–1.00)
GFR, Estimated: >60 mL/min (ref >60)
Glucose, Bld: 45 mg/dL — ABNORMAL LOW (ref 70–99)
Potassium: 4.1 mmol/L (ref 3.5–5.1)
Sodium: 138 mmol/L (ref 135–145)

## 2022-06-10 LAB — MAGNESIUM: Magnesium: 2 mg/dL (ref 1.7–2.4)

## 2022-06-10 LAB — FOLATE: Folate: 9.1 ng/mL (ref >5.9)

## 2022-06-10 LAB — PHOSPHORUS: Phosphorus: 3.6 mg/dL (ref 2.5–4.6)

## 2022-06-10 MED ORDER — NALTREXONE HCL 50 MG PO TABS
50.0000 mg | ORAL_TABLET | Freq: Every day | ORAL | Status: DC
  Administered 2022-06-10 – 2022-06-13 (×4): 50 mg via ORAL
  Filled 2022-06-10 (×4): qty 1
  Filled 2022-06-10: qty 7

## 2022-06-10 MED ORDER — QUETIAPINE FUMARATE 100 MG PO TABS
100.0000 mg | ORAL_TABLET | Freq: Every day | ORAL | Status: DC
  Administered 2022-06-10 – 2022-06-12 (×3): 100 mg via ORAL
  Filled 2022-06-10: qty 7
  Filled 2022-06-10 (×3): qty 1

## 2022-06-10 MED ORDER — ONDANSETRON HCL 4 MG PO TABS
8.0000 mg | ORAL_TABLET | Freq: Three times a day (TID) | ORAL | Status: DC | PRN
  Administered 2022-06-10: 8 mg via ORAL
  Filled 2022-06-10: qty 2

## 2022-06-10 MED ORDER — HYDROXYZINE HCL 25 MG PO TABS
25.0000 mg | ORAL_TABLET | Freq: Three times a day (TID) | ORAL | Status: DC | PRN
  Administered 2022-06-10 – 2022-06-12 (×4): 25 mg via ORAL
  Filled 2022-06-10 (×2): qty 1
  Filled 2022-06-10: qty 10
  Filled 2022-06-10 (×2): qty 1

## 2022-06-10 MED ORDER — SENNA 8.6 MG PO TABS: 1.0000 | ORAL_TABLET | Freq: Every evening | ORAL | Status: DC | PRN

## 2022-06-10 MED ORDER — BISMUTH SUBSALICYLATE 262 MG PO CHEW: 524.0000 mg | CHEWABLE_TABLET | ORAL | Status: DC | PRN

## 2022-06-10 MED ORDER — SERTRALINE HCL 50 MG PO TABS
50.0000 mg | ORAL_TABLET | Freq: Every day | ORAL | Status: DC
  Administered 2022-06-10 – 2022-06-13 (×4): 50 mg via ORAL
  Filled 2022-06-10: qty 1
  Filled 2022-06-10: qty 7
  Filled 2022-06-10 (×2): qty 1
  Filled 2022-06-10: qty 7
  Filled 2022-06-10: qty 1

## 2022-06-10 MED ORDER — POTASSIUM CHLORIDE CRYS ER 20 MEQ PO TBCR
40.0000 meq | EXTENDED_RELEASE_TABLET | Freq: Once | ORAL | Status: AC
  Administered 2022-06-10: 40 meq via ORAL
  Filled 2022-06-10: qty 2

## 2022-06-10 MED ORDER — POLYETHYLENE GLYCOL 3350 17 G PO PACK
17.0000 g | PACK | Freq: Every day | ORAL | Status: DC | PRN
  Administered 2022-06-10: 17 g via ORAL
  Filled 2022-06-10: qty 1

## 2022-06-10 MED ORDER — LACTULOSE 10 GM/15ML PO SOLN: 30.0000 g | Freq: Two times a day (BID) | ORAL | Status: DC | PRN

## 2022-06-10 MED ORDER — ALUM & MAG HYDROXIDE-SIMETH 200-200-20 MG/5ML PO SUSP: 30.0000 mL | ORAL | Status: DC | PRN

## 2022-06-10 MED ORDER — ACETAMINOPHEN 325 MG PO TABS: 650.0000 mg | ORAL_TABLET | Freq: Four times a day (QID) | ORAL | Status: DC | PRN
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
## 2022-06-10 NOTE — Group Note (Signed)
Group Topic: Overcoming Obstacles  Group Date: 06/10/2022 Start Time: 0800 End Time: 0830 Facilitators: Emmit Pomfret D, NT  Department: [facility]  Number of Participants: 5  Group Focus: daily focus Treatment Modality:  Psychoeducation Interventions utilized were story telling Purpose: enhance coping skills, express feelings, increase insight, and regain self-worth  Name: Ashley Lee Date of Birth: 1985/10/01  MR: [id number]    Level of Participation: moderate Quality of Participation: attentive Interactions with others: gave feedback Mood/Affect: appropriate Triggers (if applicable): n/a Cognition: coherent/clear Progress: Significant Response: n/a Plan: follow-up needed  Patients Problems:  Patient Active Problem List   Diagnosis Date Noted   Alcohol use disorder 06/09/2022

## 2022-06-10 NOTE — ED Notes (Signed)
Patient  sleeping in no acute stress. RR even and unlabored .Environment secured .Will continue to monitor for safely. 

## 2022-06-10 NOTE — ED Notes (Signed)
Pt sleeping@this time. breathing even and unlabored. Will continue to monitor for safety 

## 2022-06-10 NOTE — ED Notes (Signed)
Patient requested medication for anixtey

## 2022-06-10 NOTE — ED Notes (Signed)
Pt in shower@this  time. Calm and cooperative. No c/o pain or distress.will continue to monitor for safety

## 2022-06-10 NOTE — Group Note (Signed)
Group Topic: Communication  Group Date: 06/10/2022 Start Time: 1130 End Time: 1200 Facilitators: Merrie Roof, RN  Department: [facility]  Number of Participants: 5  Group Focus: safety plan Treatment Modality:  Individual Therapy Interventions utilized were assignment Purpose: express feelings  Name: Ashley Lee Date of Birth: 11-21-1985  MR: [id number]    Level of Participation: active Quality of Participation: attentive Interactions with others: gave feedback Mood/Affect: appropriate Triggers (if applicable):  Cognition: coherent/clear Progress: Significant Response:  Plan: patient will be encouraged to continue therapy  Patients Problems:  Patient Active Problem List   Diagnosis Date Noted   Alcohol use disorder 06/09/2022

## 2022-06-10 NOTE — ED Notes (Signed)
Patient in milieu. Environment is secured. Will continue to monitor for safety. 

## 2022-06-10 NOTE — ED Notes (Signed)
Glucose read 45 on patients labs .mht checked it was 124 notified provider Hospital did not call the 45 blood glucose to nurse.Nurse was just reviewing patients labs.

## 2022-06-10 NOTE — ED Notes (Addendum)
Patient reported she has not had a bowel movement in 4 days. Writer will inform provider. Patient denies SI/HI and AVH. Patient is being monitored for safety.

## 2022-06-10 NOTE — Group Note (Signed)
Group Topic: Trust and Honesty  Group Date: 06/10/2022 Start Time: 1010 End Time: 1145 Facilitators: Vonzell Schlatter B  Department: [facility]  Number of Participants: 8  Group Focus: activities of daily living skills Treatment Modality:  Psychoeducation Interventions utilized were patient education Purpose: relapse prevention strategies  Name: Ashley Lee Date of Birth: [birth year]  MR: [id number]    Level of Participation: minimal Quality of Participation: attentive and cooperative Interactions with others: gave feedback Mood/Affect: positive Triggers (if applicable): na Cognition: coherent/clear Progress: Moderate Response: na Plan: follow-up needed  Patients Problems:  Patient Active Problem List   Diagnosis Date Noted   Alcohol use disorder 06/09/2022

## 2022-06-10 NOTE — ED Notes (Signed)
Patient quested medication for nausea

## 2022-06-11 ENCOUNTER — Encounter (HOSPITAL_COMMUNITY): Payer: Self-pay

## 2022-06-11 DIAGNOSIS — R45851 Suicidal ideations: Secondary | ICD-10-CM | POA: Diagnosis not present

## 2022-06-11 DIAGNOSIS — F10229 Alcohol dependence with intoxication, unspecified: Secondary | ICD-10-CM | POA: Diagnosis not present

## 2022-06-11 DIAGNOSIS — F4310 Post-traumatic stress disorder, unspecified: Secondary | ICD-10-CM | POA: Diagnosis not present

## 2022-06-11 DIAGNOSIS — F3181 Bipolar II disorder: Secondary | ICD-10-CM | POA: Diagnosis not present

## 2022-06-11 LAB — LIPID PANEL
Cholesterol: 244 mg/dL — ABNORMAL HIGH (ref 0–200)
HDL: 105 mg/dL (ref >40)
LDL Cholesterol: 113 mg/dL — ABNORMAL HIGH (ref 0–99)
Total CHOL/HDL Ratio: 2.3 RATIO
Triglycerides: 129 mg/dL (ref <150)
VLDL: 26 mg/dL (ref 0–40)

## 2022-06-11 LAB — HEMOGLOBIN A1C
Hgb A1c MFr Bld: 4.4 % — ABNORMAL LOW (ref 4.8–5.6)
Mean Plasma Glucose: 79.58 mg/dL

## 2022-06-11 MED ORDER — VITAMIN B-12 100 MCG PO TABS
100.0000 ug | ORAL_TABLET | Freq: Every day | ORAL | Status: DC
  Administered 2022-06-11 – 2022-06-13 (×3): 100 ug via ORAL
  Filled 2022-06-11 (×3): qty 1

## 2022-06-11 NOTE — ED Notes (Signed)
Patient has been awake and alert all day.  She has made phone calls to secure aftercare, has been social with peers in the dayroom, watched TV and attended groups offered.  Patient is without complaint or somatic distress.  Will monitor and provide safe environment.

## 2022-06-11 NOTE — Discharge Instructions (Addendum)
[facility] [address] [place], [place], [address] [phone] phone  New Patient Assessment/Therapy Walk-Ins:  Monday and Wednesday: 8 am until slots are full. Every 1st and 2nd Fridays of the month: 1 pm - 5 pm.  NO ASSESSMENT/THERAPY WALK-INS ON TUESDAYS OR THURSDAYS  New Patient Assessment/Medication Management Walk-Ins:  Monday - Friday:  8 am - 11 am.  For all walk-ins, we ask that you arrive by 7:30 am because patients will be seen in the order of arrival.  Availability is limited; therefore, you may not be seen on the same day that you walk-in.  Our goal is to serve and meet the needs of our community to the best of our ability.  SUBSTANCE USE TREATMENT for Medicaid and State Funded/IPRS  [facility] [address], [place], [address] [phone] phone NOTE: [facility] is no longer offering IOP services.  Serves those who are low-income or have no insurance.  [facility] [address], [place], [place], [address] [phone] phone [phone] fax NOTE: Does have Substance Abuse-Intensive Outpatient Program (SAIOP) as well as transitional housing if eligible.  [facility] [address] [place], [place], [address] [phone] phone [phone] fax  [facility] [address] [place], [place], [address] [phone] phone [phone] fax  HALFWAY HOUSES:  [facility] [phone]  [facility] www.oxfordvacancies.com  12 STEP PROGRAMS:  Alcoholics Anonymous of [place] https://aagreensboronc.com/meeting  Narcotics Anonymous of [place] https://greensborona.org/meetings/  Al-Anon of [place], [place] www.greens[place]alanon.org/find-meetings.html  Nar-Anon https://nar-anon.org/find-a-meetin  List of Residential placements:   [facility] in [place]: [phone]  [facility]: [phone]  [facility]: [place], [place]  [phone]: Female and female facility; 30-day program: (uninsured and Medicaid such as [facility], [facility], [facility], partners)  [facility]: [phone]; men and women's facility; 28 days; Can have Medicaid tailored plan ([facility] or [facility])  [facility]: [phone] Ang[place] or [place]; 28 day program; must be fully detox; tailored Medicaid or no insurance  [facility] in [place], [place]; [phone]; 28 day all males program; no insurance accepted  [facility] in [place]: [place] [phone] (no insurance or Medicaid only); 90 days; outpatient services but provide housing in apartments [place]  [facility] Admission: [phone]: Patient must complete phone screening for placement: [place], [place]; 6 month program; uninsured, Medicaid, and [facility] insurance.   [facility]: no insurance required; [phone]  [place] [facility]: [phone]; Intake: Robert; Must fill out application online; Victor Delay [phone] x [phone]  [facility] in [place], [place]: [phone]; Admissions Coordinators Mr. Dennis or David Gibson; 90 day program.  [facility]: [place], [place] [phone]; Co-Ed 9 month to a year program; Online application; Men entry fee is $500 (6-12months);  [facility]: [address] [place], [place] [address]; no fee or insurance required; minimum of 2 years; Highly structured; work based; Intake Coordinator is Chris [phone]  [facility] in [place], : [phone]; Fax number is [phone]; website: [facility].org; Requires 3-6 page autobiography; 2 year program (18 months and then 6month transitional housing); Admission fee is $300; no insurance needed; work program  [facility] in [place], : Front Desk Staff: Reeci [phone]: They have a Men's Regenerations Program 6-9months. Free program; There is an initial $300 fee however, they are willing to work  with patients regarding that. Application is online.  [facility]: Admissions [phone] Benjamin Cox ext [phone]; Any 7-90 day program is out of pocket; 12   month program is free of charge; there is a $275 entry fee; Patient is responsible for own transportation 

## 2022-06-11 NOTE — Group Note (Signed)
Group Topic: Wellness  Group Date: 06/11/2022 Start Time: 1630 End Time: 1700 Facilitators: Jenean Lindau, RN  Department: [facility]  Number of Participants: 6  Group Focus: nursing group Treatment Modality:  Solution-Focused Therapy Interventions utilized were group exercise Purpose: enhance coping skills, express feelings, increase insight, and reinforce self-care  Name: Cheyeanne Bertelson Date of Birth: [birth year]  MR: [id number]    Level of Participation: active Quality of Participation: attentive and cooperative Interactions with others: gave feedback Mood/Affect: appropriate Triggers (if applicable):   Cognition: coherent/clear and goal directed Progress: Gaining insight Response:   Plan: follow-up needed  Patients Problems:  Patient Active Problem List   Diagnosis Date Noted   Alcohol use disorder 06/09/2022

## 2022-06-11 NOTE — Group Note (Signed)
Group Topic: Social Support  Group Date: 06/11/2022 Start Time: 0800 End Time: 0900 Facilitators: Emmit Pomfret D, NT  Department: [facility]  Number of Participants: 5  Group Focus: relapse prevention Treatment Modality:  Psychoeducation Interventions utilized were story telling Purpose: relapse prevention strategies  Name: Ashley Lee Date of Birth: 12/16/1985  MR: [id number]    Level of Participation: moderate Quality of Participation: attentive Interactions with others: gave feedback Mood/Affect: appropriate Triggers (if applicable): n/a Cognition: coherent/clear Progress: Significant Response: n/a Plan: follow-up needed  Patients Problems:  Patient Active Problem List   Diagnosis Date Noted   Alcohol use disorder 06/09/2022

## 2022-06-11 NOTE — ED Notes (Signed)
Patient remains asleep in bed without issue or complaint.  No evidence of withdrawal at this time.  Will monitor and provide a safe environment.

## 2022-06-11 NOTE — ED Provider Notes (Signed)
[facility] Progress Note  Date: 06/11/22 Patient Name: Ashley Lee MRN: [id number] Chief Complaint:  Chief Complaint  Patient presents with   Depression      Diagnosis:  Final diagnoses:  Alcohol use disorder  Passive suicidal ideations   Subjective: Patient endorses good sleep.  She denies any adverse or undesirable effects from taking quetiapine and trazodone.  She also does not endorse any side effects from the naltrexone and sertraline started yesterday.  Patient continues to experience some mild "shakes."  Today, she experiences some mild alcohol cravings. She continues to be interested in residential rehab placement.  Denies SI or HI.  Denies AVH.  Total Time spent with patient: 1 hour   Mental Status Exam  Appearance and Grooming: Patient appears overall clean.  Behavior: The patient appears in no acute distress, and during the interview, was calm, focused, required minimal redirection, and behaving appropriately to scenario. She was able to follow commands and compliant to requests and made good eye contact.  The patient did not appear internally or externally preoccupied.  Attitude: Patient's attitude towards the interviewer and other individuals present was cooperative and open.  Motor activity: There was no notable abnormal facial movements and no notable abnormal extremity movements.  Speech: The volume of her speech was within an appropriate range and normal in quantity. The rate was appropriately paced with a normal rhythm. Responses were normal in latency. There was no abnormal patterns in speech.  Mood: "I feel better"  Affect: Patient's affect is euthymic, tearful at times with broad range and even fluctuations. Her affect is appropriate for the topic of conversation. ------------------------------------------------------------------------------------------------------------------------- Perception The patient describes no  hallucinations  Thought Content The patient describes no delusional thoughts.  Patient at the time of interview denies passive or active suicidal ideations. She denies homicidal intent.  Thought Process The patient's thought process is linear and is goal-directed.  Insight The patient at the time of interview demonstrates fair insight, as evidenced by acknowledgement of substance use disorder/s, ability to identify trigger/s causing mental health decompensation, lacking understanding of mental health condition/s, and inability to identify adaptive and maladaptive coping strategies.  Judgement The patient over the past 24 hours demonstrates good judgement, as evidenced by help-seeking behavior, such as voluntary admission to mental health facility, requesting outpatient resources, seeking placement for rehab, and requesting to speak with psychiatrist, openness to starting psychotropic medications, and engaging appropriately with staff / other patients.  ROS and Physical Exam  Review of Systems  Constitutional: Negative.   Respiratory: Negative.    Cardiovascular: Negative.   Gastrointestinal: Negative.   Genitourinary: Negative.    Blood pressure 111/76, pulse 64, temperature 98.3 F (36.8 C), temperature source Oral, resp. rate 17, SpO2 99 %. There is no height or weight on file to calculate BMI. Physical Exam Vitals and nursing note reviewed.  Constitutional:      Appearance: Normal appearance.  HENT:     Head: Normocephalic and atraumatic.  Pulmonary:     Effort: Pulmonary effort is normal.  Neurological:     General: No focal deficit present.     Mental Status: She is alert. Mental status is at baseline.    Assets  Assets:Communication Skills; Desire for Improvement; Physical Health; Resilience; Social Support   Past Medical History:  Past Medical History:  Diagnosis Date   Anemia    Anxiety    No past surgical history on file.  Family History:  No family history  on file.  Social  History:  Social History   Socioeconomic History   Marital status: Single    Spouse name: Not on file   Number of children: Not on file   Years of education: Not on file   Highest education level: Not on file  Occupational History   Not on file  Tobacco Use   Smoking status: Every Day   Smokeless tobacco: Not on file  Substance and Sexual Activity   Alcohol use: Yes   Drug use: Not on file   Sexual activity: Not on file  Other Topics Concern   Not on file  Social History Narrative   Not on file   Social Determinants of Health   Financial Resource Strain: Not on file  Food Insecurity: Not on file  Transportation Needs: Not on file  Physical Activity: Not on file  Stress: Not on file  Social Connections: Not on file  Intimate Partner Violence: Not on file    SDOH:  SDOH Screenings   Depression (PHQ2-9): High Risk (06/09/2022)    ASSESSMENT  Ashley Lee is a 37 y.o. woman experiencing housing insecurity with past psychiatric history of bipolar vs substance-induced mood disorder, PTSD, unspecified anxiety, and past substance use history of alcohol use disorder who presented to [facility] seeking residential rehabilitation placement.  Patient continues to benefit from observation at [facility] due to acute factors needing psychiatric stabilization, risk of medical instability due to the effects of substance withdrawal, and unsafe disposition environment.  Problem List: #Alcohol use disorder, severe Patient drinks alcohol almost every day for 20 years. She denies alcohol withdrawal seizures or delirium tremens but does endorse alcohol intoxication hallucinosis. LFTs wnl.  - continue naltrexone 50 mg daily  #Bipolar II disorder c/b substance-induced mood disorder Patient reports hypomanic symptoms that coincide with depressive symptoms suggestive of a possible mixed state. These symptoms were reportedly still present when patient was not  intoxicated or withdrawing from alcohol. - continue quetiapine 100 mg at bedtime for sleep - continue trazodone 50 mg at bedtime as needed for sleep  #PTSD Patient meets criteria with identifiable traumatic experiences, hypervigilance, re-experiencing, and avoidance. She also endorses nightmares 3 nights a week. - continue sertraline 50 mg daily for PTSD  #Macrocytic anemia 2/2 B12 deficiency Hb 11.9. MCV 104. B12 low. Patient asymptomatic - start B12 100 mcg daily  PRN medications for symptomatic management: -- continue acetaminophen 650 mg every 6 hours as needed for mild to moderate pain, fever, and headaches -- continue hydroxyzine 25 mg three times a day as needed for anxiety -- continue bismuth subsalicylate 524 mg oral chewable tablet every 3 hours as needed for diarrhea / loose stools -- continue senna 8.6 mg oral at bedtime and polyethylene glycol 17 g oral daily as needed for mild to moderate constipation -- continue lactulose 30 g oral twice a day as needed for severe constipation -- continue ondansetron 8 mg every 8 hours as needed for nausea or vomiting -- continue aluminum-magnesium hydroxide + simethicone 30 mL every 4 hours as needed for heartburn or indigestion  Long Term Goals: Improvement in symptoms so as ready for discharge  Short Term Goals: Ability to identify changes in lifestyle to reduce recurrence of condition, verbalize feelings, identify and develop effective coping behaviors, maintain clinical measurements within normal limits, and identify triggers associated with substance abuse/mental health issues will improve. Improvement in ability to demonstrate self-control and comply with prescribed medications.  Disposition: awaiting residential rehabilitation placement  I discussed my assessment, planned testing  and intervention for the patient with Dr. Enedina Finner who agrees with my formulated course of action.  Signed: Augusto Gamble, MD 06/11/2022, 8:13 AM

## 2022-06-11 NOTE — Group Note (Signed)
Group Topic: Balance in Life  Group Date: 06/11/2022 Start Time: 0130 End Time: 0230 Facilitators: Lenny Pastel  Department: [facility]  Number of Participants: 5  Group Focus: chemical dependency issues, clarity of thought, communication, coping skills, impulsivity, personal responsibility, problem solving, relapse prevention, self-awareness, self-esteem, and substance abuse education Treatment Modality:  Behavior Modification Therapy and Psychoeducation Interventions utilized were assignment, exploration, group exercise, problem solving, and support Purpose: enhance coping skills, explore maladaptive thinking, express feelings, express irrational fears, improve communication skills, increase insight, regain self-worth, reinforce self-care, relapse prevention strategies, and trigger / craving management  Name: Ashley Lee Date of Birth: [birth year]  MR: [id number]    Level of Participation: active Quality of Participation: attentive, cooperative, motivated, and offered feedback Interactions with others: gave feedback Mood/Affect: appropriate Triggers (if applicable): N/A Cognition: coherent/clear, goal directed, insightful, and logical Progress: Moderate Response: Patient actively participated in group on today. Group started off with introductions and group rules. Group members participated in a therapeutic activity that required active listening and communication skills. Group members were able to identify similarities and differences within the group. Patient interacted positively with staff and peers. Patient stated she wanted to get back to focusing on her hobbies and positive affirmations. No issues to report.  Plan: referral / recommendations  Patients Problems:  Patient Active Problem List   Diagnosis Date Noted   Alcohol use disorder 06/09/2022

## 2022-06-11 NOTE — Group Note (Signed)
Group Topic: Change and Accountability  Group Date: 06/11/2022 Start Time: 1100 End Time: 1205 Facilitators: Vonzell Schlatter B  Department: [facility]  Number of Participants: 7  Group Focus: daily focus Treatment Modality:  Psychoeducation Interventions utilized were group exercise Purpose: increase insight  Name: Tylia Pickler Date of Birth: [birth year]  MR: [id number]    Level of Participation: active Quality of Participation: attentive and cooperative Interactions with others: gave feedback Mood/Affect: positive Triggers (if applicable): na Cognition: coherent/clear Progress: Moderate Response: na Plan: follow-up needed  Patients Problems:  Patient Active Problem List   Diagnosis Date Noted   Alcohol use disorder 06/09/2022

## 2022-06-11 NOTE — ED Notes (Signed)
Patient is attending AA meting at this time and is participating in the discussion. Calm cooperative. Will continue to monitor for safety.

## 2022-06-11 NOTE — ED Notes (Signed)
Patient is attending AA meetings a this time.She is calm and cooperative

## 2022-06-11 NOTE — Discharge Planning (Signed)
LCSW received phone call back from Joe at the [facility] program in [place]. Per Gabriel Rung, referral was received via email however, there is currently a waiting list for women that is about 3 weeks out. Joe reports there are about 15 pending applications in front of patient and he will not likely get to her application until next week. Per Gabriel Rung, she can remain on the waiting list if she is going to remain at a facility. If she discharges, she will have to be taken off as they only do a facility to facility transfer. LCSW expressed understanding and informed him that [facility] is a 3-5 day stay. Patient can remain on list until LCSW can follow up to provide disposition plan for patient. Update will be provided to patient. No other needs to report.   LCSW spoke with patient who reports he insurance in [place] has been closed and reports she can get proof if she is able to log into her online account. LCSW to complete with patient and then patient will be provided [facility] medicaid application by [facility] Specialist Lieutenant Diego. LCSW will send out additional referrals for the patient on today.   Fernande Boyden, LCSW Clinical Social Worker [place] [facility] Ph: [phone]

## 2022-06-11 NOTE — BH IP Treatment Plan (Signed)
Interdisciplinary Treatment and Diagnostic Plan Update  06/11/2022 Time of Session: 8:53AM Ahleena Sonn MRN: [id number]  Diagnosis:  Final diagnoses:  Alcohol use disorder  Passive suicidal ideations     Current Medications:  Current Facility-Administered Medications  Medication Dose Route Frequency Provider Last Rate Last Admin   acetaminophen (TYLENOL) tablet 650 mg  650 mg Oral Q6H PRN Augusto Gamble, MD       And   hydrOXYzine (ATARAX) tablet 25 mg  25 mg Oral TID PRN Augusto Gamble, MD   25 mg at 06/10/22 2103   And   bismuth subsalicylate (PEPTO BISMOL) chewable tablet 524 mg  524 mg Oral Q3H PRN Augusto Gamble, MD       And   senna (SENOKOT) tablet 8.6 mg  1 tablet Oral QHS PRN Augusto Gamble, MD       And   polyethylene glycol (MIRALAX / GLYCOLAX) packet 17 g  17 g Oral Daily PRN Augusto Gamble, MD   17 g at 06/10/22 1927   And   ondansetron (ZOFRAN) tablet 8 mg  8 mg Oral Q8H PRN Augusto Gamble, MD   8 mg at 06/10/22 1347   And   alum & mag hydroxide-simeth (MAALOX/MYLANTA) 200-200-20 MG/5ML suspension 30 mL  30 mL Oral Q4H PRN Augusto Gamble, MD       lactulose (CHRONULAC) 10 GM/15ML solution 30 g  30 g Oral BID PRN Augusto Gamble, MD       LORazepam (ATIVAN) tablet 1 mg  1 mg Oral Q6H PRN Lenard Lance, FNP   1 mg at 06/10/22 2103   multivitamin with minerals tablet 1 tablet  1 tablet Oral Daily Lenard Lance, FNP   1 tablet at 06/10/22 4098   naltrexone (DEPADE) tablet 50 mg  50 mg Oral Daily Augusto Gamble, MD   50 mg at 06/10/22 1535   nicotine (NICODERM CQ - dosed in mg/24 hours) patch 14 mg  14 mg Transdermal Daily Lenard Lance, FNP   14 mg at 06/10/22 1191   nicotine polacrilex (NICORETTE) gum 4 mg  4 mg Oral PRN Sindy Guadeloupe, NP   4 mg at 06/09/22 2157   QUEtiapine (SEROQUEL) tablet 100 mg  100 mg Oral Rosanne Sack, MD   100 mg at 06/10/22 2103   sertraline (ZOLOFT) tablet 50 mg  50 mg Oral Daily Augusto Gamble, MD   50 mg at 06/10/22 1535   thiamine (VITAMIN B1) tablet 100 mg  100 mg  Oral Daily Lenard Lance, FNP   100 mg at 06/10/22 0921   traZODone (DESYREL) tablet 50 mg  50 mg Oral QHS PRN Lenard Lance, FNP   50 mg at 06/10/22 2103   No current outpatient medications on file.   PTA Medications: Prior to Admission medications   Not on File    Patient Stressors: Financial difficulties   Marital or family conflict   Medication change or noncompliance   Substance abuse   Traumatic event    Patient Strengths: Ability for insight  Average or above average intelligence  Capable of independent living  Communication skills  General fund of knowledge  Motivation for treatment/growth   Treatment Modalities: Medication Management, Group therapy, Case management,  1 to 1 session with clinician, Psychoeducation, Recreational therapy.   Physician Treatment Plan for Primary and Secondary Diagnosis:  Final diagnoses:  Alcohol use disorder  Passive suicidal ideations   Long Term Goal(s): Improvement in symptoms so as ready for discharge  Short Term  Goals: Patient will verbalize feelings in meetings with treatment team members. Patient will attend at least of 50% of the groups daily. Pt will complete the PHQ9 on admission, day 3 and discharge. Patient will participate in completing the [place] Suicide Severity Rating Scale Patient will score a low risk of violence for 24 hours prior to discharge Patient will take medications as prescribed daily.  Medication Management: Evaluate patient's response, side effects, and tolerance of medication regimen.  Therapeutic Interventions: 1 to 1 sessions, Unit Group sessions and Medication administration.  Evaluation of Outcomes: Progressing  LCSW Treatment Plan for Primary Diagnosis:  Final diagnoses:  Alcohol use disorder  Passive suicidal ideations    Long Term Goal(s): Safe transition to appropriate next level of care at discharge.  Short Term Goals: Facilitate acceptance of mental health diagnosis and concerns  through verbal commitment to aftercare plan and appointments at discharge., Patient will identify one social support prior to discharge to aid in patient's recovery., Patient will attend AA/NA groups as scheduled., Identify minimum of 2 triggers associated with mental health/substance abuse issues with treatment team members., and Increase skills for wellness and recovery by attending 50% of scheduled groups.  Therapeutic Interventions: Assess for all discharge needs, 1 to 1 time with Child psychotherapist, Explore available resources and support systems, Assess for adequacy in community support network, Educate family and significant other(s) on suicide prevention, Complete Psychosocial Assessment, Interpersonal group therapy.  Evaluation of Outcomes: Progressing   Progress in Treatment: Attending groups: Yes. Participating in groups: Yes. Taking medication as prescribed: Yes. Toleration medication: Yes. Family/Significant other contact made: Yes, individual(s) contacted:  Patient has provided permission for LCSW to follow up with her cousin Esaw Grandchild as needed.  Patient understands diagnosis: Yes. Discussing patient identified problems/goals with staff: Yes. Medical problems stabilized or resolved: Yes. Denies suicidal/homicidal ideation: Yes. Issues/concerns per patient self-inventory: Yes. Other: alcohol use and need for residential placement  New problem(s) identified: No, Describe:  other than reported on admission.   New Short Term/Long Term Goal(s): Safe transition to appropriate next level of care at discharge, Engage patient in therapeutic group addressing interpersonal concerns. Engage patient in aftercare planning with referrals and resources, Increase ability to appropriately verbalize feelings, Facilitate acceptance of mental health diagnosis and concerns and Identify triggers associated with mental health/substance abuse issues.   Patient Goals:  Patient is seeking residential placement  at this time for substance use and is hopeful to secure placement within 3-5 days.   Discharge Plan or Barriers: LCSW will send referrals out for review for residential placement. Updates will be provided as received. Barriers may be the patient's insurance is listed as [place] Medicaid.   Reason for Continuation of Hospitalization: Anxiety Depression Medication stabilization Withdrawal symptoms  Estimated Length of Stay: 3-5 days  Last 3 [place] Suicide Severity Risk Score: Flowsheet Row ED from 06/09/2022 in [facility]  C-SSRS RISK CATEGORY Low Risk       Last [facility] 2/9 Scores:    06/09/2022    7:01 PM  Depression screen PHQ 2/9  Decreased Interest 2  Down, Depressed, Hopeless 3  PHQ - 2 Score 5  Altered sleeping 3  Tired, decreased energy 0  Change in appetite 2  Feeling bad or failure about yourself  3  Trouble concentrating 2  Moving slowly or fidgety/restless 2  Suicidal thoughts 2  PHQ-9 Score 19  Difficult doing work/chores Very difficult    Scribe for Treatment Team: Lenny Pastel 06/11/2022 9:09 AM

## 2022-06-11 NOTE — ED Notes (Signed)
Pt sleeping@this time. Breathing even and unlabored. Will continue to monitor for safety 

## 2022-06-11 NOTE — Discharge Planning (Signed)
LCSW received application from patient for the [facility] program in [facility]. Application was emailed over to the Admissions Coordinator Joe for review. Email: [email or web address]. LCSW will provide updates once received. Number to [place] Medicaid provided to the patient for her follow up.   LCSW will continue the search for residential placement. Out of State insurance will be a barrier.   Fernande Boyden, LCSW Clinical Social Worker [place] [facility] Ph: [phone]

## 2022-06-12 DIAGNOSIS — F10229 Alcohol dependence with intoxication, unspecified: Secondary | ICD-10-CM | POA: Diagnosis not present

## 2022-06-12 DIAGNOSIS — F3181 Bipolar II disorder: Secondary | ICD-10-CM | POA: Diagnosis not present

## 2022-06-12 DIAGNOSIS — F4310 Post-traumatic stress disorder, unspecified: Secondary | ICD-10-CM | POA: Diagnosis not present

## 2022-06-12 DIAGNOSIS — R45851 Suicidal ideations: Secondary | ICD-10-CM | POA: Diagnosis not present

## 2022-06-12 NOTE — ED Notes (Addendum)
Patient denies SI/HI and AVH. Patient has been pleasant with staff and anticipating finding a treatment facility prior to discharge. Patient has ate breakfast and received her medications as prescribed. Patient is being monitored for safety.

## 2022-06-12 NOTE — ED Notes (Signed)
Snacks given 

## 2022-06-12 NOTE — ED Notes (Signed)
Pt a/o x 4, in dayroom watching tv with peers. Denies SI/HI/AVH. Voices some anxiety re: dc tomorrow. Pleasant and cooperative. No noted distress. Will continue to monitor for safety

## 2022-06-12 NOTE — ED Notes (Signed)
Pt had dinner 

## 2022-06-12 NOTE — ED Notes (Signed)
Pt had breakfast  °

## 2022-06-12 NOTE — Discharge Planning (Signed)
LCSW received update from patient that she has likely been accepted into an [address] here in [place] and can likely admit on tomorrow. Patient reports she has a few more interviews and will provide updates as received. Patient expressed appreciation for LCSW assistance with seeking placement for her. LCSW will provide updates as received.   Fernande Boyden, LCSW Clinical Social Worker [place] [facility] Ph: [phone]

## 2022-06-12 NOTE — ED Provider Notes (Addendum)
[facility] Progress Note  Date: 06/12/22 Patient Name: Ashley Lee MRN: [id number] Chief Complaint:  Chief Complaint  Patient presents with   Depression      Diagnosis:  Final diagnoses:  Alcohol use disorder  Passive suicidal ideations   Subjective: Patient endorses good sleep.  She denies any adverse or undesirable effects from her prescribed medications.  She says she was feeling a little agitated which started yesterday afternoon because she was frustrated about being told she would be ineligible to go to a residential rehab. She explains that she didn't give up her son to go to rehab, only to be turned away.  She continues to experience alcohol cravings. She plans to reach out to several [facility] today to seek placement.  Denies SI or HI. Denies AVH.  Total Time spent with patient: 1 hour   Mental Status Exam  Appearance and Grooming: Patient appears overall clean.  Behavior: The patient appears in no acute distress, and during the interview, was calm, focused, required minimal redirection, and behaving appropriately to scenario. She was able to follow commands and compliant to requests and made good eye contact.  The patient did not appear internally or externally preoccupied.  Attitude: Patient's attitude towards the interviewer and other individuals present was cooperative and open.  Motor activity: There was no notable abnormal facial movements and no notable abnormal extremity movements.  Speech: The volume of her speech was within an appropriate range and normal in quantity. The rate was appropriately paced with a normal rhythm. Responses were normal in latency. There was no abnormal patterns in speech.  Mood: "So-so"  Affect: Patient's affect is dysphoric with broad range and even fluctuations. Her affect is appropriate for the topic of  conversation. ------------------------------------------------------------------------------------------------------------------------- Perception The patient describes no hallucinations  Thought Content The patient describes no delusional thoughts.  Patient at the time of interview denies passive or active suicidal ideations. She denies homicidal intent.  Thought Process The patient's thought process is linear and is goal-directed.  Insight The patient at the time of interview demonstrates fair insight, as evidenced by acknowledgement of substance use disorder/s, ability to identify trigger/s causing mental health decompensation, lacking understanding of mental health condition/s, and inability to identify adaptive and maladaptive coping strategies.  Judgement The patient over the past 24 hours demonstrates good judgement, as evidenced by help-seeking behavior, such as voluntary admission to mental health facility, requesting outpatient resources, seeking placement for rehab, and requesting to speak with psychiatrist, openness to starting psychotropic medications, and engaging appropriately with staff / other patients.  ROS and Physical Exam  Review of Systems  Constitutional: Negative.   Respiratory: Negative.    Cardiovascular: Negative.   Gastrointestinal: Negative.   Genitourinary: Negative.    Blood pressure 110/71, pulse 71, temperature 98.3 F (36.8 C), temperature source Oral, resp. rate 18, SpO2 100 %. There is no height or weight on file to calculate BMI. Physical Exam Vitals and nursing note reviewed.  Constitutional:      Appearance: Normal appearance.  HENT:     Head: Normocephalic and atraumatic.  Pulmonary:     Effort: Pulmonary effort is normal.  Neurological:     General: No focal deficit present.     Mental Status: She is alert. Mental status is at baseline.    Assets  Assets:Communication Skills; Desire for Improvement; Physical Health; Resilience;  Social Support   ASSESSMENT  Ashley Lee is a 37 y.o. woman experiencing housing insecurity with past psychiatric history of  bipolar vs substance-induced mood disorder, PTSD, unspecified anxiety, and past substance use history of alcohol use disorder who presented to [facility] seeking residential rehabilitation placement.  Patient continues to benefit from observation at [facility] due to acute factors needing psychiatric stabilization, risk of medical instability due to the effects of substance withdrawal, and unsafe disposition environment.  Problem List: #Alcohol use disorder, severe Patient drinks alcohol almost every day for 20 years. She denies alcohol withdrawal seizures or delirium tremens but does endorse alcohol intoxication hallucinosis. LFTs wnl.  - continue naltrexone 50 mg daily  #Bipolar II disorder c/b substance-induced mood disorder Patient reports hypomanic symptoms that coincide with depressive symptoms suggestive of a possible mixed state. These symptoms were reportedly still present when patient was not intoxicated or withdrawing from alcohol. - continue quetiapine 100 mg at bedtime for sleep - continue trazodone 50 mg at bedtime as needed for sleep  #PTSD Patient meets criteria with identifiable traumatic experiences, hypervigilance, re-experiencing, and avoidance. She also endorses nightmares 3 nights a week. - continue sertraline 50 mg daily for PTSD  #Macrocytic anemia 2/2 B12 deficiency Hb 11.9. MCV 104. B12 low. Patient asymptomatic - continue B12 100 mcg daily  PRN medications for symptomatic management: -- continue acetaminophen 650 mg every 6 hours as needed for mild to moderate pain, fever, and headaches -- continue hydroxyzine 25 mg three times a day as needed for anxiety -- continue bismuth subsalicylate 524 mg oral chewable tablet every 3 hours as needed for diarrhea / loose stools -- continue senna 8.6 mg oral at bedtime and  polyethylene glycol 17 g oral daily as needed for mild to moderate constipation -- continue lactulose 30 g oral twice a day as needed for severe constipation -- continue ondansetron 8 mg every 8 hours as needed for nausea or vomiting -- continue aluminum-magnesium hydroxide + simethicone 30 mL every 4 hours as needed for heartburn or indigestion  Long Term Goals: Improvement in symptoms so as ready for discharge  Short Term Goals: Ability to identify changes in lifestyle to reduce recurrence of condition, verbalize feelings, identify and develop effective coping behaviors, maintain clinical measurements within normal limits, and identify triggers associated with substance abuse/mental health issues will improve. Improvement in ability to demonstrate self-control and comply with prescribed medications.  Disposition:  awaiting [facility] acceptance  I discussed my assessment, planned testing and intervention for the patient with Dr. Lucianne Muss who agrees with my formulated course of action.  Signed: Augusto Gamble, MD 06/12/2022, 8:09 AM

## 2022-06-12 NOTE — ED Notes (Signed)
Patient observed/assessed in room in bed appearing in no immediate distress resting peacefully. Q15 minute checks continued by MHT and nursing staff. Will continue to monitor and support. 

## 2022-06-13 DIAGNOSIS — F3181 Bipolar II disorder: Secondary | ICD-10-CM | POA: Diagnosis not present

## 2022-06-13 DIAGNOSIS — F10229 Alcohol dependence with intoxication, unspecified: Secondary | ICD-10-CM | POA: Diagnosis not present

## 2022-06-13 DIAGNOSIS — F4310 Post-traumatic stress disorder, unspecified: Secondary | ICD-10-CM | POA: Diagnosis not present

## 2022-06-13 DIAGNOSIS — R45851 Suicidal ideations: Secondary | ICD-10-CM | POA: Diagnosis not present

## 2022-06-13 MED ORDER — NALTREXONE HCL 50 MG PO TABS: 50.0000 mg | ORAL_TABLET | Freq: Every day | ORAL | 0 refills | Status: AC

## 2022-06-13 MED ORDER — QUETIAPINE FUMARATE 100 MG PO TABS: 100.0000 mg | ORAL_TABLET | Freq: Every day | ORAL | 0 refills | Status: AC

## 2022-06-13 MED ORDER — CYANOCOBALAMIN 100 MCG PO TABS: 100.0000 ug | ORAL_TABLET | Freq: Every day | ORAL | 0 refills | Status: AC

## 2022-06-13 MED ORDER — NICOTINE POLACRILEX 4 MG MT GUM: 4.0000 mg | CHEWING_GUM | OROMUCOSAL | 0 refills | Status: AC | PRN

## 2022-06-13 MED ORDER — SERTRALINE HCL 50 MG PO TABS: 50.0000 mg | ORAL_TABLET | Freq: Every day | ORAL | 0 refills | Status: AC

## 2022-06-13 MED ORDER — NICOTINE 14 MG/24HR TD PT24: 14.0000 mg | MEDICATED_PATCH | Freq: Every day | TRANSDERMAL | 0 refills | Status: AC

## 2022-06-13 MED ORDER — TRAZODONE HCL 50 MG PO TABS: 50.0000 mg | ORAL_TABLET | Freq: Every evening | ORAL | 0 refills | Status: AC | PRN

## 2022-06-13 NOTE — Discharge Planning (Signed)
LCSW and MD spoke with patient on this morning regarding disposition plan. Patient reports she was accepted into the [facility] in [place]. Address: [address] [place], [place] [address]. Patient reports she was informed by [facility] that she can admit to the house on today 06/10/2022 by 4:00pm. Patient has been provided a residence number in order to get into the home once she arrives. Patient reports she is very appreciative of this opportunity as she knows this is a positive step in the right direction for her and her future. Patient reports her plan is to get a [place] identification card and report to DSS in order to get her Medicaid application submitted. Brief supportive counseling was provided to the patient on today and she was receptive to the feedback provided. Patient aware that [facility] will provide transport to location once confirmed. No other needs were reported at this time.   LCSW contacted House Representative Helmut Muster to confirm plan. Per Helmut Muster, patient has been accepted and address has been confirmed. Patient is able to arrive at the home any time after 2:00pm. No other needs were reported at this time. LCSW to sign off. Please inform if further LCSW needs arise prior to discharge.    Ashley Boyden, LCSW Clinical Social Worker [facility] Ph: [phone]

## 2022-06-13 NOTE — ED Notes (Signed)
Patient resting quietly in bed with eyes closed. Respirations equal and unlabored, skin warm and dry, NAD. No change in assessment or acuity. Q 15 minute safety checks remain in place.   

## 2022-06-13 NOTE — ED Notes (Signed)
Pt observed/assessed in room sleeping. RR even and unlabored, appearing in no noted distress. Environmental check complete, will continue to monitor for safety 

## 2022-06-13 NOTE — Group Note (Signed)
Group Topic: Change and Accountability  Group Date: 06/13/2022 Start Time: 1330 End Time: 1425 Facilitators: Vonzell Schlatter B  Department: [facility]  Number of Participants: 4  Group Focus: daily focus Treatment Modality:  Psychoeducation Interventions utilized were support Purpose: increase insight  Name: Bithiah Aube Date of Birth: Sep 15, 1985  MR: [id number]    Level of Participation: active Quality of Participation: attentive and cooperative Interactions with others: gave feedback Mood/Affect: positive Triggers (if applicable): na Cognition: coherent/clear Progress: Moderate Response: na Plan: follow-up needed  Patients Problems:  Patient Active Problem List   Diagnosis Date Noted   Alcohol use disorder 06/09/2022
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
## 2022-06-13 NOTE — Group Note (Signed)
Group Topic: Overcoming Obstacles  Group Date: 06/13/2022 Start Time: 0200 End Time: 1610 Facilitators: Lenny Pastel  Department: [facility]  Number of Participants: 4  Group Focus: daily focus, feeling awareness/expression, personal responsibility, and self-awareness Treatment Modality:  Behavior Modification Therapy, Cognitive Behavioral Therapy, and Spiritual Interventions utilized were exploration, mental fitness, and support Purpose: enhance coping skills, explore maladaptive thinking, express feelings, express irrational fears, improve communication skills, increase insight, regain self-worth, reinforce self-care, relapse prevention strategies, and trigger / craving management  Name: Camry Timoney Date of Birth: [birth year]  MR: [id number]    Level of Participation: active Quality of Participation: attentive and cooperative Interactions with others: gave feedback Mood/Affect: appropriate Triggers (if applicable): "feeling like there's no way out of her situations" Cognition: coherent/clear, goal directed, insightful, and logical Progress: Gaining insight Response: Patient actively participated in group on today. Patient reports that the video clip definitely made her reflect on the way she has viewed her situation and decision making. Patient reports feeling encouraged to focus more on her journey to recovery and not her destination. Patient reports feeling a sense of peace knowing that she has found her next level of care which will get her going in the right direction. Patient was able to provide feedback to her peers. No issues to report.  Plan: referral / recommendations  Patients Problems:  Patient Active Problem List   Diagnosis Date Noted   Alcohol use disorder 06/09/2022
